# Patient Record
Sex: Female | Born: 1960 | Race: Black or African American | Hispanic: No | Marital: Married | Smoking: Never smoker
Health system: Southern US, Community
[De-identification: ages and names within clinical notes are randomized; demographics above are authoritative.]

## PROBLEM LIST (undated history)

## (undated) DIAGNOSIS — J45909 Unspecified asthma, uncomplicated: Secondary | ICD-10-CM

## (undated) DIAGNOSIS — E119 Type 2 diabetes mellitus without complications: Secondary | ICD-10-CM

## (undated) HISTORY — PX: ABDOMINAL HYSTERECTOMY: SHX81

## (undated) HISTORY — PX: CHOLECYSTECTOMY: SHX55

---

## 2004-03-02 ENCOUNTER — Ambulatory Visit: Payer: Self-pay | Admitting: Obstetrics and Gynecology

## 2004-03-10 ENCOUNTER — Ambulatory Visit: Payer: Self-pay | Admitting: Obstetrics and Gynecology

## 2004-04-30 ENCOUNTER — Ambulatory Visit: Payer: Self-pay | Admitting: Surgery

## 2005-01-18 ENCOUNTER — Ambulatory Visit: Payer: Self-pay | Admitting: Internal Medicine

## 2005-03-30 ENCOUNTER — Ambulatory Visit: Payer: Self-pay | Admitting: Obstetrics and Gynecology

## 2005-05-24 ENCOUNTER — Other Ambulatory Visit: Payer: Self-pay

## 2005-05-24 ENCOUNTER — Emergency Department: Payer: Self-pay | Admitting: Emergency Medicine

## 2006-02-06 ENCOUNTER — Ambulatory Visit: Payer: Self-pay | Admitting: Internal Medicine

## 2006-05-24 ENCOUNTER — Ambulatory Visit: Payer: Self-pay | Admitting: Obstetrics and Gynecology

## 2007-02-18 ENCOUNTER — Ambulatory Visit: Payer: Self-pay | Admitting: Internal Medicine

## 2007-04-02 ENCOUNTER — Ambulatory Visit: Payer: Self-pay | Admitting: Emergency Medicine

## 2007-04-13 ENCOUNTER — Telehealth (INDEPENDENT_AMBULATORY_CARE_PROVIDER_SITE_OTHER): Payer: Self-pay | Admitting: *Deleted

## 2007-04-16 DIAGNOSIS — J45909 Unspecified asthma, uncomplicated: Secondary | ICD-10-CM | POA: Insufficient documentation

## 2007-04-16 DIAGNOSIS — J309 Allergic rhinitis, unspecified: Secondary | ICD-10-CM | POA: Insufficient documentation

## 2007-06-20 ENCOUNTER — Ambulatory Visit: Payer: Self-pay | Admitting: Obstetrics and Gynecology

## 2007-09-23 ENCOUNTER — Ambulatory Visit: Payer: Self-pay | Admitting: Family Medicine

## 2008-03-14 HISTORY — PX: BREAST EXCISIONAL BIOPSY: SUR124

## 2008-08-13 ENCOUNTER — Ambulatory Visit: Payer: Self-pay | Admitting: Obstetrics and Gynecology

## 2008-11-04 ENCOUNTER — Ambulatory Visit: Payer: Self-pay

## 2008-12-12 ENCOUNTER — Ambulatory Visit: Payer: Self-pay | Admitting: Family Medicine

## 2009-02-03 ENCOUNTER — Observation Stay: Payer: Self-pay | Admitting: Student

## 2010-02-16 ENCOUNTER — Ambulatory Visit: Payer: Self-pay | Admitting: Obstetrics and Gynecology

## 2010-03-11 ENCOUNTER — Ambulatory Visit: Payer: Self-pay | Admitting: Internal Medicine

## 2010-05-09 ENCOUNTER — Ambulatory Visit: Payer: Self-pay | Admitting: Family Medicine

## 2010-07-30 NOTE — Assessment & Plan Note (Signed)
[facility]                             PULMONARY OFFICE NOTE   LAKIMA, DONA                      MRN:          [id number]  DATE:02/06/2006                            DOB:          1960/11/26    PROBLEMS:  1. Allergic asthma.  2. Allergic rhinitis.   HISTORY OF PRESENT ILLNESS:  One year follow up.  Had quit allergy  vaccine two years ago.  Used rescue inhaler twice last week, only for  sensation that she could not get her throat to clear, which she has not  wheezed, and she has not needed Advair or Flonase.  She got an Health visitor, which she credits for doing better.  Has noticed some decreased  sense of smell, but says sense of taste is well preserved, and she  denies congestion in her ears, chest tightness, wheeze or purulent  discharge.   MEDICATIONS:  1. Birth control pills.  2. Albuterol Rescue Inhaler.   OBJECTIVE:  VITAL SIGNS:  Weight 274 pounds, blood pressure 128/70,  pulse rate 95, room air saturation 97%.  HEENT:  Conjunctivae, nasal mucosa and pharynx look normal.  CHEST:  Clear with no wheezes, rales or rhonchi.  HEART:  Regular without murmur or gallops.  EXTREMITIES:  No peripheral edema or cyanosis.   IMPRESSION:  Minimal intermittent asthma, mild and currently stable  allergic rhinitis.   PLAN:  I refilled her albuterol inhaler with discussion.  Suggested  trial of decongestant to see if that would improve her sense of smell,  and offered to see her again p.r.n.     Clinton D. Maple Hudson, MD, Tonny Bollman, FACP  Electronically Signed    CDY/MedQ  DD: 02/11/2006  DT: 02/13/2006  Job #: [id number]   cc:   Mickey Farber

## 2011-01-01 ENCOUNTER — Ambulatory Visit: Payer: Self-pay

## 2011-11-02 ENCOUNTER — Ambulatory Visit: Payer: Self-pay | Admitting: Obstetrics and Gynecology

## 2012-04-19 ENCOUNTER — Ambulatory Visit: Payer: Self-pay

## 2012-04-21 ENCOUNTER — Ambulatory Visit: Payer: Self-pay | Admitting: Emergency Medicine

## 2012-04-23 ENCOUNTER — Ambulatory Visit: Payer: Self-pay

## 2012-04-25 ENCOUNTER — Ambulatory Visit: Payer: Self-pay | Admitting: Family Medicine

## 2012-04-29 ENCOUNTER — Ambulatory Visit: Payer: Self-pay | Admitting: Family Medicine

## 2012-05-02 ENCOUNTER — Ambulatory Visit: Payer: Self-pay | Admitting: Family Medicine

## 2012-06-01 ENCOUNTER — Ambulatory Visit: Payer: Self-pay | Admitting: Emergency Medicine

## 2012-06-01 LAB — CBC WITH DIFFERENTIAL/PLATELET
Basophil #: 0.1 10*3/uL (ref 0.0–0.1)
Basophil %: 0.6 %
Eosinophil #: 0.1 10*3/uL (ref 0.0–0.7)
Eosinophil %: 0.8 %
HCT: 40.2 % (ref 35.0–47.0)
HGB: 12.9 g/dL (ref 12.0–16.0)
Lymphocyte #: 4.7 10*3/uL — ABNORMAL HIGH (ref 1.0–3.6)
Lymphocyte %: 36.8 %
MCH: 29.3 pg (ref 26.0–34.0)
MCHC: 32 g/dL (ref 32.0–36.0)
MCV: 92 fL (ref 80–100)
Monocyte #: 0.9 x10 3/mm (ref 0.2–0.9)
Monocyte %: 7 %
Neutrophil #: 7 10*3/uL — ABNORMAL HIGH (ref 1.4–6.5)
Neutrophil %: 54.8 %
Platelet: 299 10*3/uL (ref 150–440)
RBC: 4.38 10*6/uL (ref 3.80–5.20)
RDW: 13.8 % (ref 11.5–14.5)
WBC: 12.8 10*3/uL — ABNORMAL HIGH (ref 3.6–11.0)

## 2012-06-01 LAB — COMPREHENSIVE METABOLIC PANEL
Albumin: 3.8 g/dL (ref 3.4–5.0)
Alkaline Phosphatase: 112 U/L (ref 50–136)
Anion Gap: 10 (ref 7–16)
BUN: 14 mg/dL (ref 7–18)
Bilirubin,Total: 0.3 mg/dL (ref 0.2–1.0)
Calcium, Total: 9.8 mg/dL (ref 8.5–10.1)
Chloride: 103 mmol/L (ref 98–107)
Co2: 29 mmol/L (ref 21–32)
Creatinine: 0.83 mg/dL (ref 0.60–1.30)
EGFR (African American): >60
EGFR (Non-African Amer.): >60
Glucose: 110 mg/dL — ABNORMAL HIGH (ref 65–99)
Osmolality: 284 (ref 275–301)
Potassium: 4.2 mmol/L (ref 3.5–5.1)
SGOT(AST): 13 U/L — ABNORMAL LOW (ref 15–37)
SGPT (ALT): 32 U/L (ref 12–78)
Sodium: 142 mmol/L (ref 136–145)
Total Protein: 7.7 g/dL (ref 6.4–8.2)

## 2012-06-01 LAB — TSH: Thyroid Stimulating Horm: 2.5 u[IU]/mL

## 2012-10-18 ENCOUNTER — Emergency Department: Payer: Self-pay | Admitting: Emergency Medicine

## 2012-10-18 ENCOUNTER — Ambulatory Visit: Payer: Self-pay

## 2012-10-18 LAB — COMPREHENSIVE METABOLIC PANEL
Albumin: 3.4 g/dL (ref 3.4–5.0)
Bilirubin,Total: 0.3 mg/dL (ref 0.2–1.0)
Calcium, Total: 9 mg/dL (ref 8.5–10.1)
Chloride: 109 mmol/L — ABNORMAL HIGH (ref 98–107)
Co2: 27 mmol/L (ref 21–32)
Creatinine: 0.74 mg/dL (ref 0.60–1.30)
EGFR (Non-African Amer.): >60
Glucose: 114 mg/dL — ABNORMAL HIGH (ref 65–99)
Potassium: 3.9 mmol/L (ref 3.5–5.1)
SGOT(AST): 20 U/L (ref 15–37)
SGPT (ALT): 26 U/L (ref 12–78)
Sodium: 140 mmol/L (ref 136–145)
Total Protein: 7.4 g/dL (ref 6.4–8.2)

## 2012-10-18 LAB — URINALYSIS, COMPLETE
Bilirubin,UR: NEGATIVE
Blood: NEGATIVE
Blood: NEGATIVE
Glucose,UR: NEGATIVE mg/dL (ref 0–75)
Ketone: NEGATIVE
Leukocyte Esterase: NEGATIVE
Ph: 6 (ref 4.5–8.0)
Protein: NEGATIVE
RBC,UR: NONE SEEN /HPF (ref 0–5)
Specific Gravity: 1.015 (ref 1.003–1.030)
Specific Gravity: 1.015 (ref 1.003–1.030)

## 2012-10-18 LAB — CBC
HGB: 12.4 g/dL (ref 12.0–16.0)
MCH: 30.7 pg (ref 26.0–34.0)
MCHC: 34.4 g/dL (ref 32.0–36.0)
MCV: 89 fL (ref 80–100)
Platelet: 243 10*3/uL (ref 150–440)
WBC: 8.5 10*3/uL (ref 3.6–11.0)

## 2012-10-18 LAB — LIPASE, BLOOD: Lipase: 87 U/L (ref 73–393)

## 2012-10-18 LAB — TROPONIN I: Troponin-I: <0.02 ng/mL

## 2013-06-12 ENCOUNTER — Ambulatory Visit: Payer: Self-pay | Admitting: Family Medicine

## 2013-06-17 LAB — WOUND CULTURE

## 2013-10-31 ENCOUNTER — Ambulatory Visit: Payer: Self-pay | Admitting: Physician Assistant

## 2013-12-09 ENCOUNTER — Emergency Department: Payer: Self-pay | Admitting: Emergency Medicine

## 2013-12-09 LAB — URINALYSIS, COMPLETE
Bilirubin,UR: NEGATIVE
Glucose,UR: NEGATIVE mg/dL (ref 0–75)
KETONE: NEGATIVE
Nitrite: POSITIVE
Ph: 5 (ref 4.5–8.0)
RBC,UR: 117 /HPF (ref 0–5)
SPECIFIC GRAVITY: 1.012 (ref 1.003–1.030)
Squamous Epithelial: 4
WBC UR: 558 /HPF (ref 0–5)

## 2013-12-09 LAB — COMPREHENSIVE METABOLIC PANEL
ALBUMIN: 3.6 g/dL (ref 3.4–5.0)
ANION GAP: 8 (ref 7–16)
AST: 28 U/L (ref 15–37)
Alkaline Phosphatase: 114 U/L
BUN: 10 mg/dL (ref 7–18)
Bilirubin,Total: 0.3 mg/dL (ref 0.2–1.0)
CALCIUM: 8.5 mg/dL (ref 8.5–10.1)
CHLORIDE: 110 mmol/L — AB (ref 98–107)
CREATININE: 0.76 mg/dL (ref 0.60–1.30)
Co2: 23 mmol/L (ref 21–32)
EGFR (African American): >60
EGFR (Non-African Amer.): >60
GLUCOSE: 189 mg/dL — AB (ref 65–99)
OSMOLALITY: 285 (ref 275–301)
POTASSIUM: 4 mmol/L (ref 3.5–5.1)
SGPT (ALT): 41 U/L
Sodium: 141 mmol/L (ref 136–145)
TOTAL PROTEIN: 7.5 g/dL (ref 6.4–8.2)

## 2013-12-09 LAB — CBC
HCT: 40.1 % (ref 35.0–47.0)
HGB: 12.9 g/dL (ref 12.0–16.0)
MCH: 29.4 pg (ref 26.0–34.0)
MCHC: 32.3 g/dL (ref 32.0–36.0)
MCV: 91 fL (ref 80–100)
PLATELETS: 290 10*3/uL (ref 150–440)
RBC: 4.41 10*6/uL (ref 3.80–5.20)
RDW: 14.2 % (ref 11.5–14.5)
WBC: 12 10*3/uL — AB (ref 3.6–11.0)

## 2013-12-11 LAB — URINE CULTURE

## 2014-01-21 ENCOUNTER — Ambulatory Visit: Payer: Self-pay | Admitting: Gastroenterology

## 2014-05-21 ENCOUNTER — Ambulatory Visit: Payer: Self-pay | Admitting: Obstetrics and Gynecology

## 2014-06-16 ENCOUNTER — Ambulatory Visit
Admit: 2014-06-16 | Disposition: A | Discharge status: Home / Self Care | Payer: Self-pay | Attending: Internal Medicine | Admitting: Internal Medicine

## 2014-07-07 LAB — SURGICAL PATHOLOGY

## 2014-08-18 ENCOUNTER — Encounter: Payer: Self-pay | Admitting: Dietician

## 2014-08-18 ENCOUNTER — Encounter: Payer: 59 | Attending: Internal Medicine | Admitting: Dietician

## 2014-08-18 VITALS — Wt 252.7 lb

## 2014-08-18 DIAGNOSIS — E119 Type 2 diabetes mellitus without complications: Secondary | ICD-10-CM | POA: Insufficient documentation

## 2014-08-18 NOTE — Progress Notes (Signed)

## 2014-09-29 ENCOUNTER — Ambulatory Visit: Payer: 59

## 2014-10-06 ENCOUNTER — Ambulatory Visit: Payer: 59

## 2014-10-12 ENCOUNTER — Ambulatory Visit
Admission: EM | Admit: 2014-10-12 | Discharge: 2014-10-12 | Disposition: A | Discharge status: Home / Self Care | Payer: 59 | Attending: Emergency Medicine | Admitting: Emergency Medicine

## 2014-10-12 DIAGNOSIS — L0291 Cutaneous abscess, unspecified: Secondary | ICD-10-CM

## 2014-10-12 DIAGNOSIS — E119 Type 2 diabetes mellitus without complications: Secondary | ICD-10-CM | POA: Insufficient documentation

## 2014-10-12 DIAGNOSIS — J3089 Other allergic rhinitis: Secondary | ICD-10-CM

## 2014-10-12 DIAGNOSIS — Z79899 Other long term (current) drug therapy: Secondary | ICD-10-CM | POA: Diagnosis not present

## 2014-10-12 DIAGNOSIS — L02214 Cutaneous abscess of groin: Secondary | ICD-10-CM | POA: Insufficient documentation

## 2014-10-12 DIAGNOSIS — J302 Other seasonal allergic rhinitis: Secondary | ICD-10-CM | POA: Insufficient documentation

## 2014-10-12 DIAGNOSIS — J029 Acute pharyngitis, unspecified: Secondary | ICD-10-CM | POA: Diagnosis present

## 2014-10-12 HISTORY — DX: Type 2 diabetes mellitus without complications: E11.9

## 2014-10-12 LAB — RAPID STREP SCREEN (MED CTR MEBANE ONLY): STREPTOCOCCUS, GROUP A SCREEN (DIRECT): NEGATIVE

## 2014-10-12 MED ORDER — SULFAMETHOXAZOLE-TRIMETHOPRIM 800-160 MG PO TABS: 1.0000 | ORAL_TABLET | Freq: Two times a day (BID) | ORAL | Status: AC

## 2014-10-12 MED ORDER — MUPIROCIN CALCIUM 2 % EX CREA: 1.0000 "application " | TOPICAL_CREAM | Freq: Three times a day (TID) | CUTANEOUS | Status: DC

## 2014-10-12 NOTE — ED Notes (Signed)
Sore throat for about a week. Not improving feels worse at night. Runny nose.

## 2014-10-12 NOTE — Discharge Instructions (Signed)
° ° °  Warm packs or heat pad to inflamed area Hairdryer to dry Cotton underthings Avoid talc powders Antibiotics by mouth twice daily x 10 days Cream antibiotic as needed  Abscess An abscess is an infected area that contains a collection of pus and debris.It can occur in almost any part of the body. An abscess is also known as a furuncle or boil. CAUSES  An abscess occurs when tissue gets infected. This can occur from blockage of oil or sweat glands, infection of hair follicles, or a minor injury to the skin. As the body tries to fight the infection, pus collects in the area and creates pressure under the skin. This pressure causes pain. People with weakened immune systems have difficulty fighting infections and get certain abscesses more often.  SYMPTOMS Usually an abscess develops on the skin and becomes a painful mass that is red, warm, and tender. If the abscess forms under the skin, you may feel a moveable soft area under the skin. Some abscesses break open (rupture) on their own, but most will continue to get worse without care. The infection can spread deeper into the body and eventually into the bloodstream, causing you to feel ill.  DIAGNOSIS  Your caregiver will take your medical history and perform a physical exam. A sample of fluid may also be taken from the abscess to determine what is causing your infection. TREATMENT  Your caregiver may prescribe antibiotic medicines to fight the infection. However, taking antibiotics alone usually does not cure an abscess. Your caregiver may need to make a small cut (incision) in the abscess to drain the pus. In some cases, gauze is packed into the abscess to reduce pain and to continue draining the area. HOME CARE INSTRUCTIONS   Only take over-the-counter or prescription medicines for pain, discomfort, or fever as directed by your caregiver.  If you were prescribed antibiotics, take them as directed. Finish them even if you start to feel  better.  If gauze is used, follow your caregiver's directions for changing the gauze.  To avoid spreading the infection:  Keep your draining abscess covered with a bandage.  Wash your hands well.  Do not share personal care items, towels, or whirlpools with others.  Avoid skin contact with others.  Keep your skin and clothes clean around the abscess.  Keep all follow-up appointments as directed by your caregiver. SEEK MEDICAL CARE IF:   You have increased pain, swelling, redness, fluid drainage, or bleeding.  You have muscle aches, chills, or a general ill feeling.  You have a fever. MAKE SURE YOU:   Understand these instructions.  Will watch your condition.  Will get help right away if you are not doing well or get worse. Document Released: 12/08/2004 Document Revised: 08/30/2011 Document Reviewed: 05/13/2011 [facility] Patient Information 2015 [place], [place]. This information is not intended to replace advice given to you by your health care provider. Make sure you discuss any questions you have with your health care provider.

## 2014-10-14 ENCOUNTER — Encounter: Payer: Self-pay | Admitting: Physician Assistant

## 2014-10-14 LAB — CULTURE, GROUP A STREP (THRC)

## 2014-10-14 NOTE — ED Provider Notes (Signed)
CSN: [id number]     Arrival date & time 10/12/14  1155 History   First MD Initiated Contact with Patient 10/12/14 1224     Chief Complaint  Patient presents with  . Sore Throat   (Consider location/radiation/quality/duration/timing/severity/associated sxs/prior Treatment) HPI 54 yo F concerned about scratchy sore throat for past few days, no fever. Has sinus congestion and post nasal drip that is irritating, clears throat frequently, evening non-productive cough  Also has recurrent issue with abscess formation in bilateral axilla and the mons pubis and groin area. Treated with warm packs and antibiotics in past. Has not had surgical consultation. Uses cotton underthings.  Diabetic/metformin.   Overweight 252   Reports recent A1C as 5.3, congratulated !  112 FBS  Past Medical History  Diagnosis Date  . Diabetes mellitus without complication    History reviewed. No pertinent past surgical history. History reviewed. No pertinent family history. History  Substance Use Topics  . Smoking status: Never Smoker   . Smokeless tobacco: Not on file  . Alcohol Use: No   OB History    No data available     Review of Systems Constitutional -afebrile Eyes-denies visual changes ENT- normal voice,reports sore throat few days-see above notes CV-denies chest pain Resp-denies SOB GI- negative for nausea,vomiting, diarrhea GU- negative for dysuria MSK- negative for back pain, ambulatory Skin-  Allergies  Latex and Penicillins  Home Medications   Prior to Admission medications   Medication Sig Start Date End Date Taking? Authorizing Provider  acetaminophen (TYLENOL) 500 MG tablet 1-2 tablets by mouth  as needed for pain    Historical Provider, MD  cyanocobalamin (,VITAMIN B-12,) 1000 MCG/ML injection 1,000 mcg monthly.    Historical Provider, MD  estrogens, conjugated, (PREMARIN) 0.3 MG tablet Take by mouth. 05/12/14   Historical Provider, MD  etodolac (LODINE) 500 MG tablet Take by mouth.     Historical Provider, MD  glimepiride (AMARYL) 4 MG tablet Take by mouth. 05/02/14 05/02/15  Historical Provider, MD  metFORMIN (GLUCOPHAGE-XR) 500 MG 24 hr tablet Take by mouth. 05/02/14 05/02/15  Historical Provider, MD  mupirocin cream (BACTROBAN) 2 % Apply 1 application topically 3 (three) times daily. 10/12/14   Jan Fireman, PA-C  pantoprazole (PROTONIX) 40 MG tablet Take by mouth. 01/30/14 01/30/15  Historical Provider, MD  phentermine (ADIPEX-P) 37.5 MG tablet Take by mouth.    Historical Provider, MD  sulfamethoxazole-trimethoprim (BACTRIM DS,SEPTRA DS) 800-160 MG per tablet Take 1 tablet by mouth 2 (two) times daily. 10/12/14 10/19/14  Jan Fireman, PA-C   BP 123/72 mmHg  Pulse 84  Temp(Src) 98 F (36.7 C) (Oral)  Resp 20  Ht 5' 7.5" (1.715 m)  SpO2 98% Physical Exam   Constitutional -alert and oriented,well appearing, mild sore throat distress Head-atraumatic, normocephalic Eyes- conjunctiva normal, EOMI ,conjugate gaze Nose- no congestion or rhinorrhea Mouth/throat- mucous membranes moist ,erythematous, no exudate   Strep Neg Neck- supple without glandular enlargement CV- regular rate, grossly normal heart sounds,  Resp-no distress, normal respiratory effort,clear to auscultation bilaterally GI- soft,non-tender,no distention GU- see skin notes below MSK- no tender, normal ROM, all extremities, ambulatory, self-care Neuro- normal speech and language, no gross focal neurological deficit appreciated, no gait instability, Skin-scarring, mild distortion of bilateral axillary areas from previous infections; resolving small abscess right and larger ,tender current abscess in left axilla- not organized enough to I&D. Similar changes of the mons pubis with scarring and small areas in different stages of resolving, linars 3 cm firm tender band  left lateral mons, not draining, + induration Neuro- negative headache,focal weakness or numbness Psych-mood and affect grossly normal; speech and  behavior grossly normal ED Course  Procedures (including critical care time) Labs Review Labs Reviewed  RAPID STREP SCREEN (NOT AT [facility])  CULTURE, GROUP A STREP ([facility] ONLY)    strep screen negative  Imaging Review No results found.   MDM   1. Environmental and seasonal allergies   2. Abscess    Plan: 1. Test results-strep negative  and diagnosis suspected seasonal allergies- reviewed with patient 2. Rx as per orders Claritin /Flonase; risks, benefits, potential side effects reviewed with patient 3. Recommend supportive treatment with guaifenesin DM, tylenol/ibuprofen  4. Warm compresses to irritated skin areas- do not pick or squeeze. Use Hairdryer to gently dry involved skin-do not burn. ! Do not pick or squeeze! Use topical Rx on anything that is tender or draining. Please stop using talcum powder same areas.   Continue weight loss-exercise- has difficulty with sweating--suggest fresh cotton panties mid-day if feasible in office- Avoid warm/dark/wet and strive for cool/bright/dry in play clothes as well and night clothes. Discharge Medication List as of 10/12/2014 12:56 PM    START taking these medications   Details  mupirocin cream (BACTROBAN) 2 % Apply 1 application topically 3 (three) times daily., Starting 10/12/2014, Until Discontinued, Normal    sulfamethoxazole-trimethoprim (BACTRIM DS,SEPTRA DS) 800-160 MG per tablet Take 1 tablet by mouth 2 (two) times daily., Starting 10/12/2014, Until Sun 10/19/14, Normal          Jan Fireman, PA-C 10/14/14 1202

## 2014-10-20 ENCOUNTER — Encounter: Payer: Self-pay | Admitting: *Deleted

## 2014-10-27 ENCOUNTER — Ambulatory Visit
Admission: EM | Admit: 2014-10-27 | Discharge: 2014-10-27 | Disposition: A | Discharge status: Home / Self Care | Payer: 59 | Attending: Family Medicine | Admitting: Family Medicine

## 2014-10-27 ENCOUNTER — Encounter: Payer: Self-pay | Admitting: Registered Nurse

## 2014-10-27 ENCOUNTER — Ambulatory Visit: Payer: 59

## 2014-10-27 DIAGNOSIS — B349 Viral infection, unspecified: Secondary | ICD-10-CM | POA: Diagnosis not present

## 2014-10-27 DIAGNOSIS — K5900 Constipation, unspecified: Secondary | ICD-10-CM | POA: Diagnosis not present

## 2014-10-27 DIAGNOSIS — L740 Miliaria rubra: Secondary | ICD-10-CM | POA: Diagnosis not present

## 2014-10-27 DIAGNOSIS — R109 Unspecified abdominal pain: Secondary | ICD-10-CM | POA: Diagnosis present

## 2014-10-27 DIAGNOSIS — H6593 Unspecified nonsuppurative otitis media, bilateral: Secondary | ICD-10-CM | POA: Diagnosis not present

## 2014-10-27 HISTORY — DX: Unspecified asthma, uncomplicated: J45.909

## 2014-10-27 LAB — COMPREHENSIVE METABOLIC PANEL
ALT: 22 U/L (ref 14–54)
AST: 16 U/L (ref 15–41)
Albumin: 4.4 g/dL (ref 3.5–5.0)
Alkaline Phosphatase: 77 U/L (ref 38–126)
Anion gap: 7 (ref 5–15)
BILIRUBIN TOTAL: 0.3 mg/dL (ref 0.3–1.2)
BUN: 14 mg/dL (ref 6–20)
CHLORIDE: 102 mmol/L (ref 101–111)
CO2: 30 mmol/L (ref 22–32)
Calcium: 9.6 mg/dL (ref 8.9–10.3)
Creatinine, Ser: 0.75 mg/dL (ref 0.44–1.00)
Glucose, Bld: 70 mg/dL (ref 65–99)
POTASSIUM: 3.9 mmol/L (ref 3.5–5.1)
Sodium: 139 mmol/L (ref 135–145)
TOTAL PROTEIN: 8.6 g/dL — AB (ref 6.5–8.1)

## 2014-10-27 LAB — URINALYSIS COMPLETE WITH MICROSCOPIC (ARMC ONLY)
BILIRUBIN URINE: NEGATIVE
Bacteria, UA: NONE SEEN — AB
Glucose, UA: NEGATIVE mg/dL
Hgb urine dipstick: NEGATIVE
KETONES UR: NEGATIVE mg/dL
Leukocytes, UA: NEGATIVE
Nitrite: NEGATIVE
PH: 6 (ref 5.0–8.0)
Protein, ur: NEGATIVE mg/dL
RBC / HPF: NONE SEEN RBC/hpf (ref <3)
SPECIFIC GRAVITY, URINE: 1.01 (ref 1.005–1.030)
WBC, UA: NONE SEEN WBC/hpf (ref <3)

## 2014-10-27 LAB — CBC WITH DIFFERENTIAL/PLATELET
Basophils Absolute: 0.1 10*3/uL (ref 0–0.1)
Basophils Relative: 1 %
EOS PCT: 1 %
Eosinophils Absolute: 0.1 10*3/uL (ref 0–0.7)
HCT: 38.8 % (ref 35.0–47.0)
Hemoglobin: 13 g/dL (ref 12.0–16.0)
LYMPHS ABS: 3 10*3/uL (ref 1.0–3.6)
Lymphocytes Relative: 33 %
MCH: 30.5 pg (ref 26.0–34.0)
MCHC: 33.5 g/dL (ref 32.0–36.0)
MCV: 91.2 fL (ref 80.0–100.0)
Monocytes Absolute: 0.5 10*3/uL (ref 0.2–0.9)
Monocytes Relative: 5 %
Neutro Abs: 5.4 10*3/uL (ref 1.4–6.5)
Neutrophils Relative %: 60 %
PLATELETS: 294 10*3/uL (ref 150–440)
RBC: 4.25 MIL/uL (ref 3.80–5.20)
RDW: 13.4 % (ref 11.5–14.5)
WBC: 9.1 10*3/uL (ref 3.6–11.0)

## 2014-10-27 LAB — AMYLASE: Amylase: 68 U/L (ref 28–100)

## 2014-10-27 LAB — LIPASE, BLOOD: LIPASE: 11 U/L — AB (ref 22–51)

## 2014-10-27 MED ORDER — FLUTICASONE PROPIONATE 50 MCG/ACT NA SUSP: 1.0000 | Freq: Two times a day (BID) | NASAL | Status: DC

## 2014-10-27 MED ORDER — ONDANSETRON 8 MG PO TBDP
8.0000 mg | ORAL_TABLET | Freq: Once | ORAL | Status: AC
  Administered 2014-10-27: 8 mg via ORAL

## 2014-10-27 MED ORDER — ONDANSETRON 8 MG PO TBDP: 8.0000 mg | ORAL_TABLET | Freq: Two times a day (BID) | ORAL | Status: DC

## 2014-10-27 MED ORDER — LORATADINE 10 MG PO TABS: 10.0000 mg | ORAL_TABLET | Freq: Every day | ORAL | Status: DC

## 2014-10-27 NOTE — Discharge Instructions (Signed)
High-Fiber Diet Fiber is found in fruits, vegetables, and grains. A high-fiber diet encourages the addition of more whole grains, legumes, fruits, and vegetables in your diet. The recommended amount of fiber for adult males is 38 g per day. For adult females, it is 25 g per day. Pregnant and lactating women should get 28 g of fiber per day. If you have a digestive or bowel problem, ask your caregiver for advice before adding high-fiber foods to your diet. Eat a variety of high-fiber foods instead of only a select few type of foods.  PURPOSE  To increase stool bulk.  To make bowel movements more regular to prevent constipation.  To lower cholesterol.  To prevent overeating. WHEN IS THIS DIET USED?  It may be used if you have constipation and hemorrhoids.  It may be used if you have uncomplicated diverticulosis (intestine condition) and irritable bowel syndrome.  It may be used if you need help with weight management.  It may be used if you want to add it to your diet as a protective measure against atherosclerosis, diabetes, and cancer. SOURCES OF FIBER  Whole-grain breads and cereals.  Fruits, such as apples, oranges, bananas, berries, prunes, and pears.  Vegetables, such as green peas, carrots, sweet potatoes, beets, broccoli, cabbage, spinach, and artichokes.  Legumes, such split peas, soy, lentils.  Almonds. FIBER CONTENT IN FOODS Starches and Grains / Dietary Fiber (g)  Cheerios, 1 cup / 3 g  Corn Flakes cereal, 1 cup / 0.7 g  Rice crispy treat cereal, 1 cup / 0.3 g  Instant oatmeal (cooked),  cup / 2 g  Frosted wheat cereal, 1 cup / 5.1 g  Brown, long-grain rice (cooked), 1 cup / 3.5 g  White, long-grain rice (cooked), 1 cup / 0.6 g  Enriched macaroni (cooked), 1 cup / 2.5 g Legumes / Dietary Fiber (g)  Baked beans (canned, plain, or vegetarian),  cup / 5.2 g  Kidney beans (canned),  cup / 6.8 g  Pinto beans (cooked),  cup / 5.5 g Breads and Crackers  / Dietary Fiber (g)  Plain or honey graham crackers, 2 squares / 0.7 g  Saltine crackers, 3 squares / 0.3 g  Plain, salted pretzels, 10 pieces / 1.8 g  Whole-wheat bread, 1 slice / 1.9 g  White bread, 1 slice / 0.7 g  Raisin bread, 1 slice / 1.2 g  Plain bagel, 3 oz / 2 g  Flour tortilla, 1 oz / 0.9 g  Corn tortilla, 1 small / 1.5 g  Hamburger or hotdog bun, 1 small / 0.9 g Fruits / Dietary Fiber (g)  Apple with skin, 1 medium / 4.4 g  Sweetened applesauce,  cup / 1.5 g  Banana,  medium / 1.5 g  Grapes, 10 grapes / 0.4 g  Orange, 1 small / 2.3 g  Raisin, 1.5 oz / 1.6 g  Melon, 1 cup / 1.4 g Vegetables / Dietary Fiber (g)  Green beans (canned),  cup / 1.3 g  Carrots (cooked),  cup / 2.3 g  Broccoli (cooked),  cup / 2.8 g  Peas (cooked),  cup / 4.4 g  Mashed potatoes,  cup / 1.6 g  Lettuce, 1 cup / 0.5 g  Corn (canned),  cup / 1.6 g  Tomato,  cup / 1.1 g Document Released: 02/28/2005 Document Revised: 08/30/2011 Document Reviewed: 06/02/2011 [employer] Patient Information 2015 [place], [place]. This information is not intended to replace advice given to you by your health care provider.  Make sure you discuss any questions you have with your health care provider. Constipation Constipation is when a person has fewer than three bowel movements a week, has difficulty having a bowel movement, or has stools that are dry, hard, or larger than normal. As people grow older, constipation is more common. If you try to fix constipation with medicines that make you have a bowel movement (laxatives), the problem may get worse. Long-term laxative use may cause the muscles of the colon to become weak. A low-fiber diet, not taking in enough fluids, and taking certain medicines may make constipation worse.  CAUSES   Certain medicines, such as antidepressants, pain medicine, iron supplements, antacids, and water pills.   Certain diseases, such as diabetes, irritable  bowel syndrome (IBS), thyroid disease, or depression.   Not drinking enough water.   Not eating enough fiber-rich foods.   Stress or travel.   Lack of physical activity or exercise.   Ignoring the urge to have a bowel movement.   Using laxatives too much.  SIGNS AND SYMPTOMS   Having fewer than three bowel movements a week.   Straining to have a bowel movement.   Having stools that are hard, dry, or larger than normal.   Feeling full or bloated.   Pain in the lower abdomen.   Not feeling relief after having a bowel movement.  DIAGNOSIS  Your health care provider will take a medical history and perform a physical exam. Further testing may be done for severe constipation. Some tests may include:  A barium enema X-ray to examine your rectum, colon, and, sometimes, your small intestine.   A sigmoidoscopy to examine your lower colon.   A colonoscopy to examine your entire colon. TREATMENT  Treatment will depend on the severity of your constipation and what is causing it. Some dietary treatments include drinking more fluids and eating more fiber-rich foods. Lifestyle treatments may include regular exercise. If these diet and lifestyle recommendations do not help, your health care provider may recommend taking over-the-counter laxative medicines to help you have bowel movements. Prescription medicines may be prescribed if over-the-counter medicines do not work.  HOME CARE INSTRUCTIONS   Eat foods that have a lot of fiber, such as fruits, vegetables, whole grains, and beans.  Limit foods high in fat and processed sugars, such as french fries, hamburgers, cookies, candies, and soda.   A fiber supplement may be added to your diet if you cannot get enough fiber from foods.   Drink enough fluids to keep your urine clear or pale yellow.   Exercise regularly or as directed by your health care provider.   Go to the restroom when you have the urge to go. Do not hold  it.   Only take over-the-counter or prescription medicines as directed by your health care provider. Do not take other medicines for constipation without talking to your health care provider first.  [place] IF:   You have bright red blood in your stool.   Your constipation lasts for more than 4 days or gets worse.   You have abdominal or rectal pain.   You have thin, pencil-like stools.   You have unexplained weight loss. MAKE SURE YOU:   Understand these instructions.  Will watch your condition.  Will get help right away if you are not doing well or get worse. Document Released: 11/27/2003 Document Revised: 03/05/2013 Document Reviewed: 12/10/2012 [facility] Patient Information 2015 [place], [place]. This information is not intended to replace advice  given to you by your health care provider. Make sure you discuss any questions you have with your health care provider. Viral Gastroenteritis Viral gastroenteritis is also known as stomach flu. This condition affects the stomach and intestinal tract. It can cause sudden diarrhea and vomiting. The illness typically lasts 3 to 8 days. Most people develop an immune response that eventually gets rid of the virus. While this natural response develops, the virus can make you quite ill. CAUSES  Many different viruses can cause gastroenteritis, such as rotavirus or noroviruses. You can catch one of these viruses by consuming contaminated food or water. You may also catch a virus by sharing utensils or other personal items with an infected person or by touching a contaminated surface. SYMPTOMS  The most common symptoms are diarrhea and vomiting. These problems can cause a severe loss of body fluids (dehydration) and a body salt (electrolyte) imbalance. Other symptoms may include:  Fever.  Headache.  Fatigue.  Abdominal pain. DIAGNOSIS  Your caregiver can usually diagnose viral gastroenteritis based on your symptoms  and a physical exam. A stool sample may also be taken to test for the presence of viruses or other infections. TREATMENT  This illness typically goes away on its own. Treatments are aimed at rehydration. The most serious cases of viral gastroenteritis involve vomiting so severely that you are not able to keep fluids down. In these cases, fluids must be given through an intravenous line (IV). HOME CARE INSTRUCTIONS   Drink enough fluids to keep your urine clear or pale yellow. Drink small amounts of fluids frequently and increase the amounts as tolerated.  Ask your caregiver for specific rehydration instructions.  Avoid:  Foods high in sugar.  Alcohol.  Carbonated drinks.  Tobacco.  Juice.  Caffeine drinks.  Extremely hot or cold fluids.  Fatty, greasy foods.  Too much intake of anything at one time.  Dairy products until 24 to 48 hours after diarrhea stops.  You may consume probiotics. Probiotics are active cultures of beneficial bacteria. They may lessen the amount and number of diarrheal stools in adults. Probiotics can be found in yogurt with active cultures and in supplements.  Wash your hands well to avoid spreading the virus.  Only take over-the-counter or prescription medicines for pain, discomfort, or fever as directed by your caregiver. Do not give aspirin to children. Antidiarrheal medicines are not recommended.  Ask your caregiver if you should continue to take your regular prescribed and over-the-counter medicines.  Keep all follow-up appointments as directed by your caregiver. SEEK IMMEDIATE MEDICAL CARE IF:   You are unable to keep fluids down.  You do not urinate at least once every 6 to 8 hours.  You develop shortness of breath.  You notice blood in your stool or vomit. This may look like coffee grounds.  You have abdominal pain that increases or is concentrated in one small area (localized).  You have persistent vomiting or diarrhea.  You have a  fever.  The patient is a child younger than 3 months, and he or she has a fever.  The patient is a child older than 3 months, and he or she has a fever and persistent symptoms.  The patient is a child older than 3 months, and he or she has a fever and symptoms suddenly get worse.  The patient is a baby, and he or she has no tears when crying. MAKE SURE YOU:   Understand these instructions.  Will watch your condition.  Will  get help right away if you are not doing well or get worse. Document Released: 02/28/2005 Document Revised: 05/23/2011 Document Reviewed: 12/15/2010 [facility] Patient Information 2015 [place], [place]. This information is not intended to replace advice given to you by your health care provider. Make sure you discuss any questions you have with your health care provider. Heat Rash Heat rash (miliaria) is a skin irritation caused by heavy sweating during hot, humid weather. It results from blockage of the sweat glands on our body. It can occur at any age. It is most common in young children whose sweat ducts are still developing or are not fully developed. Tight clothing may make the condition worse. Heat rash can look like small blisters (vesicles) that break open easily with bathing or minimal pressure. These blisters are found most commonly on the face, upper trunk of children and the trunk of adults. It can also look like a red cluster of red bumps or pimples (pustules). These usually itch and can also sometimes burn. It is more likely to occur on the neck and upper chest, in the groin, under the breasts, and in elbow creases. HOME CARE INSTRUCTIONS   The best treatment for heat rash is to provide a cooler, less humid environment where sweating is much decreased.  Keep the affected area dry. Dusting powder (cornstarch powder, baby powder) may be used to increase comfort. Avoid using ointments or creams. They keep the skin warm and moist and may make the condition  worse.  Treating heat rash is simple and usually does not require medical assistance. SEEK MEDICAL CARE IF:   There is any evidence of infection such as fever, redness, swelling.  There is discomfort such as pain.  The skin lesions do no resolve with cooler, dryer environment. MAKE SURE YOU:   Understand these instructions.  Will watch your condition.  Will get help right away if you are not doing well or get worse. Document Released: 02/16/2009 Document Revised: 05/23/2011 Document Reviewed: 02/16/2009 [facility] Patient Information 2015 [place], [place]. This information is not intended to replace advice given to you by your health care provider. Make sure you discuss any questions you have with your health care provider. Otitis Media With Effusion Otitis media with effusion is the presence of fluid in the middle ear. This is a common problem in children, which often follows ear infections. It may be present for weeks or longer after the infection. Unlike an acute ear infection, otitis media with effusion refers only to fluid behind the ear drum and not infection. Children with repeated ear and sinus infections and allergy problems are the most likely to get otitis media with effusion. CAUSES  The most frequent cause of the fluid buildup is dysfunction of the eustachian tubes. These are the tubes that drain fluid in the ears to the back of the nose (nasopharynx). SYMPTOMS   The main symptom of this condition is hearing loss. As a result, you or your child may:  Listen to the TV at a loud volume.  Not respond to questions.  Ask "what" often when spoken to.  Mistake or confuse one sound or word for another.  There may be a sensation of fullness or pressure but usually not pain. DIAGNOSIS   Your health care provider will diagnose this condition by examining you or your child's ears.  Your health care provider may test the pressure in you or your child's ear with a  tympanometer.  A hearing test may be conducted if the  problem persists. TREATMENT   Treatment depends on the duration and the effects of the effusion.  Antibiotics, decongestants, nose drops, and cortisone-type drugs (tablets or nasal spray) may not be helpful.  Children with persistent ear effusions may have delayed language or behavioral problems. Children at risk for developmental delays in hearing, learning, and speech may require referral to a specialist earlier than children not at risk.  You or your child's health care provider may suggest a referral to an ear, nose, and throat surgeon for treatment. The following may help restore normal hearing:  Drainage of fluid.  Placement of ear tubes (tympanostomy tubes).  Removal of adenoids (adenoidectomy). HOME CARE INSTRUCTIONS   Avoid secondhand smoke.  Infants who are breastfed are less likely to have this condition.  Avoid feeding infants while they are lying flat.  Avoid known environmental allergens.  Avoid people who are sick. SEEK MEDICAL CARE IF:   Hearing is not better in 3 months.  Hearing is worse.  Ear pain.  Drainage from the ear.  Dizziness. MAKE SURE YOU:   Understand these instructions.  Will watch your condition.  Will get help right away if you are not doing well or get worse. Document Released: 04/07/2004 Document Revised: 07/15/2013 Document Reviewed: 09/25/2012 [facility] Patient Information 2015 [place], [place]. This information is not intended to replace advice given to you by your health care provider. Make sure you discuss any questions you have with your health care provider.

## 2014-10-27 NOTE — ED Provider Notes (Signed)
CSN: [id number]     Arrival date & time 10/27/14  1138 History   First MD Initiated Contact with Patient 10/27/14 1211     Chief Complaint  Patient presents with  . Abdominal Pain  . Chills  . Nausea   (Consider location/radiation/quality/duration/timing/severity/associated sxs/prior Treatment) HPI Comments: African [place] female diabetic here with nausea, hot flashes, chills, abdomen pain right side and epigastric, vomiting last yesterday after eating watermelon.  1 sick exposure at home.  Symptoms first started 23 Oct 2014 left work early and went home to bed due to sharp pains abdomen low.  Was constipated this weekend no BMs and typically has three BMs per day.  BM this am smaller than normal flat brown formed.  Last seen at [facility] for sore throat and rash by Laurey Morale and those symptoms have completely resolved.  Felt a lump in right lower abdomen this am but now gone also noticed 1 pimple on right flank at waistline.  Throat feels like she has pill stuck in it this am.  Headache and dizzy today.  Was out in heat Saturday 13 Aug her usual back pain slightly worse today especially low back.  Urine paler than normal slight increased urgency.  Fingerstick this am 92 and previous 112.  Patient is a 54 y.o. female presenting with abdominal pain. The history is provided by the patient.  Abdominal Pain Pain location:  RLQ, epigastric and RUQ Pain quality: aching and cramping   Pain quality: not bloating, not burning, not dull, no fullness, not gnawing, not heavy, no pressure, not sharp, not shooting, not squeezing, not stabbing, no stiffness, not tearing, not throbbing and not tugging   Pain radiates to:  Does not radiate Pain severity:  Moderate Onset quality:  Sudden Duration:  5 days Timing:  Intermittent Progression:  Waxing and waning Chronicity:  New Context: alcohol use, awakening from sleep, eating, previous surgery, retching and sick contacts   Context: not diet changes, not laxative  use, not medication withdrawal, not recent illness, not recent sexual activity, not recent travel, not suspicious food intake and not trauma   Relieved by:  Nothing Worsened by:  Palpation Ineffective treatments:  Bowel activity, heat, not moving, position changes, eating, lying down, vomiting, palpation, movement, flatus and belching Associated symptoms: belching, chills, constipation, fatigue, fever, flatus, nausea and vomiting   Associated symptoms: no anorexia, no chest pain, no cough, no diarrhea, no dysuria, no hematemesis, no hematochezia, no hematuria, no melena, no shortness of breath, no sore throat, no vaginal bleeding and no vaginal discharge   Fatigue:    Severity:  Moderate   Duration:  4 days   Timing:  Constant   Progression:  Unchanged Fever:    Duration:  4 days   Timing:  Intermittent   Temp source:  Subjective   Progression:  Unchanged Nausea:    Severity:  Moderate   Onset quality:  Sudden   Duration:  4 days   Timing:  Intermittent   Progression:  Unchanged Vomiting:    Quality:  Stomach contents   Number of occurrences:  2   Severity:  Moderate   Duration:  2 days   Timing:  Intermittent   Progression:  Unchanged Risk factors: obesity   Risk factors: no alcohol abuse, no aspirin use, not elderly, has not had multiple surgeries, no NSAID use, not pregnant and no recent hospitalization     Past Medical History  Diagnosis Date  . Diabetes mellitus without complication   . Asthma  Past Surgical History  Procedure Laterality Date  . Abdominal hysterectomy     History reviewed. No pertinent family history. Social History  Substance Use Topics  . Smoking status: Never Smoker   . Smokeless tobacco: None  . Alcohol Use: No   OB History    No data available     Review of Systems  Constitutional: Positive for fever, chills and fatigue. Negative for diaphoresis, activity change and appetite change.  HENT: Negative for congestion, dental problem,  drooling, ear discharge, ear pain, facial swelling, hearing loss, mouth sores, nosebleeds, postnasal drip, rhinorrhea, sinus pressure, sneezing, sore throat, tinnitus and voice change.   Eyes: Negative for photophobia, pain, discharge, redness, itching and visual disturbance.  Respiratory: Negative for cough, choking, chest tightness, shortness of breath, wheezing and stridor.   Cardiovascular: Negative for chest pain, palpitations and leg swelling.  Gastrointestinal: Positive for nausea, vomiting, abdominal pain, constipation and flatus. Negative for diarrhea, blood in stool, melena, hematochezia, abdominal distention, anal bleeding, rectal pain, anorexia and hematemesis.  Endocrine: Positive for cold intolerance and heat intolerance. Negative for polydipsia, polyphagia and polyuria.  Genitourinary: Negative for dysuria, hematuria, vaginal bleeding and vaginal discharge.  Musculoskeletal: Positive for myalgias and back pain. Negative for joint swelling, arthralgias, gait problem, neck pain and neck stiffness.  Skin: Positive for rash. Negative for color change, pallor and wound.  Allergic/Immunologic: Positive for environmental allergies. Negative for food allergies.  Neurological: Positive for dizziness and headaches. Negative for tremors, seizures, syncope, facial asymmetry, speech difficulty, weakness, light-headedness and numbness.  Hematological: Negative for adenopathy. Does not bruise/bleed easily.  Psychiatric/Behavioral: Negative for behavioral problems, confusion, sleep disturbance and agitation.    Allergies  Latex and Penicillins  Home Medications   Prior to Admission medications   Medication Sig Start Date End Date Taking? Authorizing Provider  acetaminophen (TYLENOL) 500 MG tablet 1-2 tablets by mouth  as needed for pain    Historical Provider, MD  cyanocobalamin (,VITAMIN B-12,) 1000 MCG/ML injection 1,000 mcg monthly.    Historical Provider, MD  estrogens, conjugated,  (PREMARIN) 0.3 MG tablet Take by mouth. 05/12/14   Historical Provider, MD  etodolac (LODINE) 500 MG tablet Take by mouth.    Historical Provider, MD  fluticasone (FLONASE) 50 MCG/ACT nasal spray Place 1 spray into both nostrils 2 (two) times daily. 10/27/14   Olen Cordial, NP  glimepiride (AMARYL) 4 MG tablet Take by mouth. 05/02/14 05/02/15  Historical Provider, MD  loratadine (CLARITIN) 10 MG tablet Take 1 tablet (10 mg total) by mouth daily. 10/27/14   Olen Cordial, NP  metFORMIN (GLUCOPHAGE-XR) 500 MG 24 hr tablet Take by mouth. 05/02/14 05/02/15  Historical Provider, MD  mupirocin cream (BACTROBAN) 2 % Apply 1 application topically 3 (three) times daily. 10/12/14   Jan Fireman, PA-C  ondansetron (ZOFRAN ODT) 8 MG disintegrating tablet Take 1 tablet (8 mg total) by mouth 2 (two) times daily. 10/27/14   Olen Cordial, NP  pantoprazole (PROTONIX) 40 MG tablet Take by mouth. 01/30/14 01/30/15  Historical Provider, MD  phentermine (ADIPEX-P) 37.5 MG tablet Take by mouth.    Historical Provider, MD   BP 143/78 mmHg  Temp(Src) 98.2 F (36.8 C) (Oral)  Resp 16  SpO2 100% Physical Exam  Constitutional: She is oriented to person, place, and time. Vital signs are normal. She appears well-developed and well-nourished. No distress.  HENT:  Head: Normocephalic and atraumatic.  Right Ear: External ear normal.  Left Ear: External ear normal.  Nose: Nose normal.  Mouth/Throat: Oropharynx is clear and moist. No oropharyngeal exudate.  Eyes: Conjunctivae, EOM and lids are normal. Pupils are equal, round, and reactive to light. Right eye exhibits no discharge. Left eye exhibits no discharge. No scleral icterus.  Neck: Trachea normal and normal range of motion. Neck supple. No tracheal deviation present. No thyromegaly present.  Cardiovascular: Normal rate, regular rhythm, normal heart sounds and intact distal pulses.  Exam reveals no gallop and no friction rub.   No murmur heard. Pulmonary/Chest:  Effort normal and breath sounds normal. No stridor. No respiratory distress. She has no wheezes. She has no rales. She exhibits no tenderness.  Abdominal: Soft. She exhibits no shifting dullness, no distension, no pulsatile liver, no fluid wave, no abdominal bruit, no ascites, no pulsatile midline mass and no mass. Bowel sounds are decreased. There is no hepatosplenomegaly. There is tenderness in the right upper quadrant, right lower quadrant and epigastric area. There is no rigidity, no rebound, no guarding, no CVA tenderness, no tenderness at McBurney's point and negative Murphy's sign. No hernia. Hernia confirmed negative in the ventral area.  Dull to percussion x 4 quads  Musculoskeletal: Normal range of motion. She exhibits no edema or tenderness.  Lymphadenopathy:    She has no cervical adenopathy.  Neurological: She is alert and oriented to person, place, and time. She exhibits normal muscle tone. Coordination normal.  Skin: Skin is warm, dry and intact. Rash noted. No abrasion, no bruising, no burn, no ecchymosis, no laceration, no petechiae and no purpura noted. Rash is papular. Rash is not macular, not maculopapular, not nodular, not pustular, not vesicular and not urticarial. She is not diaphoretic. No cyanosis or erythema. No pallor. Nails show no clubbing.     Single nonerythematous papular lesion 0.5cm TTP right flank at waistline pants; stretch marks entire waist striations not tender to palpation  Psychiatric: She has a normal mood and affect. Her speech is normal and behavior is normal. Judgment and thought content normal. Cognition and memory are normal.  Nursing note and vitals reviewed.   ED Course  Procedures (including critical care time) Labs Review Labs Reviewed  COMPREHENSIVE METABOLIC PANEL - Abnormal; Notable for the following:    Total Protein 8.6 (*)    All other components within normal limits  LIPASE, BLOOD - Abnormal; Notable for the following:    Lipase 11 (*)     All other components within normal limits  URINALYSIS COMPLETEWITH MICROSCOPIC ([facility] ONLY) - Abnormal; Notable for the following:    Bacteria, UA NONE SEEN (*)    Squamous Epithelial / LPF 0-5 (*)    All other components within normal limits  CBC WITH DIFFERENTIAL/PLATELET  AMYLASE   Medications  ondansetron (ZOFRAN-ODT) disintegrating tablet 8 mg (8 mg Oral Given 10/27/14 1300)   Imaging Review Dg Abd 1 View  10/27/2014   CLINICAL DATA:  Abdominal pain, nausea, dizziness  EXAM: ABDOMEN - 1 VIEW  COMPARISON:  Ultrasound of the abdomen of 02/03/2009  FINDINGS: Supine views of the abdomen show a moderate amount of feces throughout the colon. No bowel obstruction is seen. There is slight lumbar curvature convex to the right with degenerative change at L4-5 primarily involving the facet joints. The SI joints appear corticated. Surgical clips are present in the right upper quadrant from prior cholecystectomy.  IMPRESSION: No bowel obstruction. Moderate amount of feces throughout the colon.   Electronically Signed   By: Ivar Drape M.D.   On: 10/27/2014 13:27   1330 RN Jonelle Sidle Nolon Lennert  reported serum samples drawn, patient now up in chair nausea resolved feeling better attempting po hydration.  1355 discussed lab and xray results with patient and given copy of reports.  Patient reported she is feeling much better tolerated diet ginger ale 233ml without return of nausea/vomiting.  Patient feels like she can void now discussed clean catch technique with her and she ambulated without difficulty to bathroom.  Dizzyness resolved.  Patient was sitting up in chair stated she is feeling much better also.  Patient verbalized understanding of information/instructions and had no further questions at this time.  1430 discussed urinalysis results with patient still feeling well ready to go home urinalysis normal/negative for infection.  Repeat abdomen exam no longer tender to palpation, dull to percussion x 4 quads.   Denied nausea and dizziness.  Patient verbalized understanding of information and instructions and had no further questions at this time. MDM   1. Constipation, unspecified constipation type   2. Viral illness   3. Otitis media with effusion, bilateral   4. Heat rash    Discussed with patient:  High-fiber diet Eat more high-fiber foods, which will help prevent constipation.  The best sources of fiber are whole-grain cereals, such as shredded wheat or cereals with bran.  Fresh fruit and raw or cooked vegetables, especially asparagus, cabbage, carrots, corn, and broccoli are other good sources of fiber.   . Fluids  Drink plenty of water.  This helps to soften bowel movements so they are easier to pass.   Exercise.  May require fiber supplement since she states "picky eater" trying to increase fruits/fibers and whole grains in diet.  Attempt diet modification.  Patient given Exitcare handout on constipation/dietary fiber. Keep active as much as possible as movement of body helps to keep bowels active as well versus dehydration/lying in bed/immobility. Patient agreed with plan of care verbalized understanding of information/instructions and had no further questions at this time. P2:  increase fruits/fiber/whole grains and fluid intake  Viral illness: no evidence of invasive bacterial infection, non toxic and well hydrated.  This is most likely self limiting viral infection.  I do not see where any further testing or imaging is necessary at this time.   I will suggest supportive care, rest, good hygiene and encourage the patient to take adequate fluids.  Continue flonase 1 spray each nostril BID prn, nasal saline 1-2 sprays each nostril prn q2h.  Discussed honey with lemon and salt water gargles for comfort also.  The patient is to return to clinic or EMERGENCY ROOM if symptoms worsen or change significantly e.g. fever, lethargy, SOB, wheezing.  Exitcare handout on viral illness given to patient.   Patient given work excuse for 24 hours.  I have recommended clear fluids and the BRAT diet. Avoid dairy, spicy, fried and meat until returned to normal.  Zofran 8mg  ODT po BID Rx #6 RF0 given to patient in case nausea/vomiting returns. Medications as directed.  Return to the clinic if  symptoms persist or worsen; I have alerted the patient to call if high fever, unable to urinate every 8 hours, dehydration, marked weakness, fainting, increased abdominal pain, blood in stool or vomit.  Patient verbalized agreement and understanding of treatment plan.   P2:  Hand washing and fitness  Discussed with patient fluid in ears may take up to 1 month to resolve after URI symptoms resolve.  Fluid in ears may also cause dizziness or vertigo.  Supportive treatment.   No evidence of invasive bacterial infection,  non toxic and well hydrated.  This is most likely self limiting viral infection.  I do not see where any further testing or imaging is necessary at this time.   I will suggest supportive care, rest, good hygiene and encourage the patient to take adequate fluids.  The patient is to return to clinic or EMERGENCY ROOM if symptoms worsen or change significantly e.g. ear pain, fever, purulent discharge from ears or bleeding.  Exitcare handout on otitis media with effusion given to patient.  Patient verbalized agreement and understanding of treatment plan.    Discussed with patient do not stay in sweaty clothes, change as soon as possible, dry off thoroughly after showering.  Call or return to clinic as needed if these symptoms worsen or fail to improve as anticipated.  Exitcare handout on heat rash given to patient.  Patient verbalized agreement and understanding of treatment plan.   P2:  Avoidance and hand washing.   Olen Cordial, NP 10/27/14 1453

## 2014-10-27 NOTE — ED Notes (Signed)
Pt states that she has been having lower right lateral pain since 10/22/2014. Pt states that she has nausea that has increased over the past few days. Pt states her pain is starting to decrease

## 2015-01-28 ENCOUNTER — Ambulatory Visit
Admission: EM | Admit: 2015-01-28 | Discharge: 2015-01-28 | Disposition: A | Discharge status: Home / Self Care | Payer: 59 | Attending: Family Medicine | Admitting: Family Medicine

## 2015-01-28 DIAGNOSIS — J4521 Mild intermittent asthma with (acute) exacerbation: Secondary | ICD-10-CM

## 2015-01-28 MED ORDER — GUAIFENESIN-CODEINE 100-10 MG/5ML PO SOLN: 10.0000 mL | Freq: Four times a day (QID) | ORAL | Status: DC | PRN

## 2015-01-28 MED ORDER — AZITHROMYCIN 250 MG PO TABS: ORAL_TABLET | ORAL | Status: DC

## 2015-01-28 MED ORDER — PREDNISONE 20 MG PO TABS: 20.0000 mg | ORAL_TABLET | Freq: Every day | ORAL | Status: DC

## 2015-01-28 MED ORDER — IPRATROPIUM-ALBUTEROL 0.5-2.5 (3) MG/3ML IN SOLN: 3.0000 mL | Freq: Once | RESPIRATORY_TRACT | Status: DC

## 2015-01-28 NOTE — ED Provider Notes (Signed)
CSN: [id number]     Arrival date & time 01/28/15  1703 History   First MD Initiated Contact with Patient 01/28/15 1759     Chief Complaint  Patient presents with  . URI   (Consider location/radiation/quality/duration/timing/severity/associated sxs/prior Treatment) Patient is a 54 y.o. female presenting with URI. The history is provided by the patient.  URI Presenting symptoms: congestion, cough, ear pain and rhinorrhea   Severity:  Moderate Onset quality:  Sudden Duration:  5 days Timing:  Constant Progression:  Worsening Chronicity:  New Relieved by:  None tried Worsened by:  Nothing tried Associated symptoms: headaches, sinus pain and wheezing   Associated symptoms: no arthralgias, no myalgias, no sneezing and no swollen glands   Risk factors: chronic respiratory disease (patient with a h/o mild intermittent asthma; uses albuterol occasionally)     Past Medical History  Diagnosis Date  . Diabetes mellitus without complication   . Asthma    Past Surgical History  Procedure Laterality Date  . Abdominal hysterectomy     No family history on file. Social History  Substance Use Topics  . Smoking status: Never Smoker   . Smokeless tobacco: Not on file  . Alcohol Use: No   OB History    No data available     Review of Systems  HENT: Positive for congestion, ear pain and rhinorrhea. Negative for sneezing.   Respiratory: Positive for cough and wheezing.   Musculoskeletal: Negative for myalgias and arthralgias.  Neurological: Positive for headaches.    Allergies  Latex and Penicillins  Home Medications   Prior to Admission medications   Medication Sig Start Date End Date Taking? Authorizing Provider  acetaminophen (TYLENOL) 500 MG tablet 1-2 tablets by mouth  as needed for pain   Yes Historical Provider, MD  estrogens, conjugated, (PREMARIN) 0.3 MG tablet Take by mouth. 05/12/14  Yes Historical Provider, MD  fluticasone (FLONASE) 50 MCG/ACT nasal spray Place 1  spray into both nostrils 2 (two) times daily. 10/27/14  Yes Tina A Betancourt, NP  glimepiride (AMARYL) 4 MG tablet Take by mouth. 05/02/14 05/02/15 Yes Historical Provider, MD  loratadine (CLARITIN) 10 MG tablet Take 1 tablet (10 mg total) by mouth daily. 10/27/14  Yes Olen Cordial, NP  metFORMIN (GLUCOPHAGE-XR) 500 MG 24 hr tablet Take by mouth. 05/02/14 05/02/15 Yes Historical Provider, MD  pantoprazole (PROTONIX) 40 MG tablet Take by mouth. 01/30/14 01/30/15 Yes Historical Provider, MD  azithromycin (ZITHROMAX Z-PAK) 250 MG tablet 2 tabs po once day 1, then 1 tab po qd for next 4 days 01/28/15   Norval Gable, MD  cyanocobalamin (,VITAMIN B-12,) 1000 MCG/ML injection 1,000 mcg monthly.    Historical Provider, MD  etodolac (LODINE) 500 MG tablet Take by mouth.    Historical Provider, MD  guaiFENesin-codeine 100-10 MG/5ML syrup Take 10 mLs by mouth every 6 (six) hours as needed for cough. 01/28/15   Norval Gable, MD  mupirocin cream (BACTROBAN) 2 % Apply 1 application topically 3 (three) times daily. 10/12/14   Jan Fireman, PA-C  ondansetron (ZOFRAN ODT) 8 MG disintegrating tablet Take 1 tablet (8 mg total) by mouth 2 (two) times daily. 10/27/14   Olen Cordial, NP  phentermine (ADIPEX-P) 37.5 MG tablet Take by mouth.    Historical Provider, MD  predniSONE (DELTASONE) 20 MG tablet Take 1 tablet (20 mg total) by mouth daily. 01/28/15   Norval Gable, MD   Meds Ordered and Administered this Visit   Medications  ipratropium-albuterol (DUONEB) 0.5-2.5 (3) MG/3ML  nebulizer solution 3 mL (not administered)    BP 144/64 mmHg  Pulse 81  Temp(Src) 98.8 F (37.1 C) (Oral)  Resp 16  Ht 5' 7.5" (1.715 m)  Wt 257 lb (116.574 kg)  BMI 39.63 kg/m2  SpO2 99% No data found.   Physical Exam  Constitutional: She appears well-developed and well-nourished. No distress.  HENT:  Head: Normocephalic.  Right Ear: Tympanic membrane, external ear and ear canal normal.  Left Ear: Tympanic membrane,  external ear and ear canal normal.  Nose: Nose normal.  Mouth/Throat: Uvula is midline, oropharynx is clear and moist and mucous membranes are normal.  Eyes: Conjunctivae and EOM are normal. Pupils are equal, round, and reactive to light. Right eye exhibits no discharge. Left eye exhibits no discharge. No scleral icterus.  Neck: Normal range of motion. Neck supple. No JVD present. No tracheal deviation present. No thyromegaly present.  Cardiovascular: Normal rate, regular rhythm, normal heart sounds and intact distal pulses.   No murmur heard. Pulmonary/Chest: Effort normal. No stridor. No respiratory distress. She has wheezes (bilateral inspiratory and expiratory wheezes and diffuse rhonchi). She has no rales. She exhibits no tenderness.  Musculoskeletal: She exhibits no edema.  Lymphadenopathy:    She has no cervical adenopathy.  Neurological: She is alert.  Skin: Skin is warm. No rash noted. She is not diaphoretic.  Nursing note and vitals reviewed.   ED Course  Procedures (including critical care time)  Labs Review Labs Reviewed - No data to display  Imaging Review No results found.   Visual Acuity Review  Right Eye Distance:   Left Eye Distance:   Bilateral Distance:    Right Eye Near:   Left Eye Near:    Bilateral Near:         MDM   1. Asthmatic bronchitis with exacerbation, mild intermittent    Discharge Medication List as of 01/28/2015  6:49 PM    START taking these medications   Details  azithromycin (ZITHROMAX Z-PAK) 250 MG tablet 2 tabs po once day 1, then 1 tab po qd for next 4 days, Normal    guaiFENesin-codeine 100-10 MG/5ML syrup Take 10 mLs by mouth every 6 (six) hours as needed for cough., Starting 01/28/2015, Until Discontinued, Print    predniSONE (DELTASONE) 20 MG tablet Take 1 tablet (20 mg total) by mouth daily., Starting 01/28/2015, Until Discontinued, Normal      1. diagnosis reviewed with patient 2. rx as per orders above; reviewed  possible side effects, interactions, risks and benefits  3. Patient given Duoneb nebulizer treatment x1 in clinic with improvement of symptoms 4. Continue home albuterol inhaler   5. Recommend supportive treatment with increased fluids 6. Follow-up prn if symptoms worsen or don't improve    Norval Gable, MD 01/28/15 2118

## 2015-01-28 NOTE — ED Notes (Signed)
Coughing (productive with yellow sputum), nasal congestion/ rhinorrhea, chest pain with cough, facial pain, plugged ear sensation and itchy ears bilaterally. Sx began with a sore throat on Sunday, which has improved.

## 2015-02-23 ENCOUNTER — Encounter: Payer: Self-pay | Admitting: Emergency Medicine

## 2015-02-23 ENCOUNTER — Ambulatory Visit (INDEPENDENT_AMBULATORY_CARE_PROVIDER_SITE_OTHER): Payer: 59

## 2015-02-23 ENCOUNTER — Ambulatory Visit
Admission: EM | Admit: 2015-02-23 | Discharge: 2015-02-23 | Disposition: A | Discharge status: Home / Self Care | Payer: 59 | Attending: Family Medicine | Admitting: Family Medicine

## 2015-02-23 DIAGNOSIS — R109 Unspecified abdominal pain: Secondary | ICD-10-CM

## 2015-02-23 LAB — BASIC METABOLIC PANEL
Anion gap: 8 (ref 5–15)
BUN: 10 mg/dL (ref 6–20)
CALCIUM: 9.3 mg/dL (ref 8.9–10.3)
CO2: 26 mmol/L (ref 22–32)
CREATININE: 0.7 mg/dL (ref 0.44–1.00)
Chloride: 106 mmol/L (ref 101–111)
GFR calc Af Amer: >60 mL/min (ref >60)
GFR calc non Af Amer: >60 mL/min (ref >60)
GLUCOSE: 85 mg/dL (ref 65–99)
Potassium: 4 mmol/L (ref 3.5–5.1)
Sodium: 140 mmol/L (ref 135–145)

## 2015-02-23 LAB — URINALYSIS COMPLETE WITH MICROSCOPIC (ARMC ONLY)
Bacteria, UA: NONE SEEN
Bilirubin Urine: NEGATIVE
Glucose, UA: NEGATIVE mg/dL
Hgb urine dipstick: NEGATIVE
Ketones, ur: NEGATIVE mg/dL
Leukocytes, UA: NEGATIVE
Nitrite: NEGATIVE
Protein, ur: NEGATIVE mg/dL
Specific Gravity, Urine: 1.015 (ref 1.005–1.030)
pH: 5.5 (ref 5.0–8.0)

## 2015-02-23 LAB — CBC WITH DIFFERENTIAL/PLATELET
BASOS PCT: 1 %
Basophils Absolute: 0.1 10*3/uL (ref 0–0.1)
Eosinophils Absolute: 0.2 10*3/uL (ref 0–0.7)
Eosinophils Relative: 2 %
HEMATOCRIT: 38.3 % (ref 35.0–47.0)
Hemoglobin: 12.7 g/dL (ref 12.0–16.0)
LYMPHS PCT: 33 %
Lymphs Abs: 3.5 10*3/uL (ref 1.0–3.6)
MCH: 30.2 pg (ref 26.0–34.0)
MCHC: 33.2 g/dL (ref 32.0–36.0)
MCV: 90.9 fL (ref 80.0–100.0)
Monocytes Absolute: 0.6 10*3/uL (ref 0.2–0.9)
Monocytes Relative: 6 %
NEUTROS ABS: 6.1 10*3/uL (ref 1.4–6.5)
Neutrophils Relative %: 58 %
Platelets: 264 10*3/uL (ref 150–440)
RBC: 4.21 MIL/uL (ref 3.80–5.20)
RDW: 13.7 % (ref 11.5–14.5)
WBC: 10.4 10*3/uL (ref 3.6–11.0)

## 2015-02-23 LAB — LIPASE, BLOOD: Lipase: 25 U/L (ref 11–51)

## 2015-02-23 MED ORDER — OXYCODONE-ACETAMINOPHEN 5-325 MG PO TABS: 1.0000 | ORAL_TABLET | Freq: Three times a day (TID) | ORAL | Status: DC | PRN

## 2015-02-23 MED ORDER — KETOROLAC TROMETHAMINE 60 MG/2ML IM SOLN
60.0000 mg | Freq: Once | INTRAMUSCULAR | Status: AC
  Administered 2015-02-23: 60 mg via INTRAMUSCULAR

## 2015-02-23 NOTE — ED Provider Notes (Signed)
[facility]  ____________________________________________  Time seen: Approximately 7:27 PM  I have reviewed the triage vital signs and the nursing notes.   HISTORY  Chief Complaint Abdominal Pain and Back Pain   HPI Tonya Bender is a 54 y.o. female presents with complaints of left flank pain since Saturday. Patient reports that this afternoon approximately 3 PM the pain increased. Patient states that Saturday 1 pain onset happened she was just sitting at home. Denies fall or trauma. Denies history of similar. States occasionally feels that the pain is wrapping around to her left groin. Patient also reports intermittent urinary frequency and urgency. Denies burning with urination. Denies blood in urine. Denies bowel changes, abnormal stool colors, blood in stool, dark stool.  States current left flank pain is 8 out of 10 sharp stabbing pain. States the pain is constant but with intermittent increased pain. Denies changes with position movements. Denies aggravating factors. Reports intermittent nausea, denies vomiting. Denies diarrhea. Denies fevers. Denies vaginal discharge, vaginal odor or vaginal complaints.   PCP: Raechel Ache   Past Medical History  Diagnosis Date  . Diabetes mellitus without complication ([place])   . Asthma     Patient Active Problem List   Diagnosis Date Noted  . ALLERGIC RHINITIS 04/16/2007  . ALLERGIC ASTHMA 04/16/2007    Past Surgical History  Procedure Laterality Date  . Abdominal hysterectomy    . Cholecystectomy      Current Outpatient Rx  Name  Route  Sig  Dispense  Refill  . acetaminophen (TYLENOL) 500 MG tablet      1-2 tablets by mouth  as needed for pain         . azithromycin (ZITHROMAX Z-PAK) 250 MG tablet      2 tabs po once day 1, then 1 tab po qd for next 4 days   6 each   0   . cyanocobalamin (,VITAMIN B-12,) 1000 MCG/ML injection      1,000 mcg monthly.         . estrogens, conjugated, (PREMARIN) 0.3 MG tablet  Oral   Take by mouth.         . etodolac (LODINE) 500 MG tablet   Oral   Take by mouth.         .           . glimepiride (AMARYL) 4 MG tablet   Oral   Take by mouth.         . =          . loratadine (CLARITIN) 10 MG tablet   Oral   Take 1 tablet (10 mg total) by mouth daily.         . metFORMIN (GLUCOPHAGE-XR) 500 MG 24 hr tablet   Oral   Take by mouth.         .           .           . EXPIRED: pantoprazole (PROTONIX) 40 MG tablet   Oral   Take by mouth.         . phentermine (ADIPEX-P) 37.5 MG tablet   Oral   Take by mouth.         .             Allergies Latex and Penicillins  History reviewed. No pertinent family history.  Social History Social History  Substance Use Topics  . Smoking status: Never Smoker   . Smokeless tobacco: None  .  Alcohol Use: No    Review of Systems Constitutional: No fever/chills Eyes: No visual changes. ENT: No sore throat. Cardiovascular: Denies chest pain. Respiratory: Denies shortness of breath. Gastrointestinal: No abdominal pain.  No nausea, no vomiting.  No diarrhea.  No constipation. Genitourinary:Positive for left flank pain. Positive for urinary urgency Musculoskeletal: Negative for back pain. Skin: Negative for rash. Neurological: Negative for headaches, focal weakness or numbness.  10-point ROS otherwise negative.  ____________________________________________   PHYSICAL EXAM:  VITAL SIGNS: ED Triage Vitals  Enc Vitals Group     BP 02/23/15 1800 153/88 mmHg     Pulse Rate 02/23/15 1800 77     Resp 02/23/15 1800 17     Temp 02/23/15 1800 97.4 F (36.3 C)     Temp Source 02/23/15 1800 Tympanic     SpO2 02/23/15 1800 98 %     Weight 02/23/15 1800 262 lb (118.842 kg)     Height 02/23/15 1800 5' 7.5" (1.715 m)     Head Cir --      Peak Flow --      Pain Score 02/23/15 1805 8     Pain Loc --      Pain Edu? --      Excl. in [place]? --     Constitutional: Alert and oriented. Well appearing  and in no acute distress. Eyes: Conjunctivae are normal. PERRL. EOMI. Head: Atraumatic.  Nose: No congestion/rhinnorhea.  Mouth/Throat: Mucous membranes are moist.  Oropharynx non-erythematous. Neck: No stridor.  No cervical spine tenderness to palpation. Hematological/Lymphatic/Immunilogical: No cervical lymphadenopathy. Cardiovascular: Normal rate, regular rhythm. Grossly normal heart sounds.  Good peripheral circulation. Respiratory: Normal respiratory effort.  No retractions. Lungs CTAB. no wheezes, rales or rhonchi. Good air movement.  Gastrointestinal: minimal tenderness to palpation left lower quadrant, abdomen otherwise soft and nontender.  Obese abdomen. Normal Bowel sounds. Moderate tenderness to palpation left CVA. No right CVA tenderness.  Musculoskeletal: No lower or upper extremity tenderness nor edema.  No joint effusions. Bilateral pedal pulses equal and easily palpated. No midline cervical, thoracic, or lumbar TTP. Bilateral straight leg test negative.  Neurologic:  Normal speech and language. No gross focal neurologic deficits are appreciated. No gait instability. Skin:  Skin is warm, dry and intact. No rash noted. Psychiatric: Mood and affect are normal. Speech and behavior are normal.  ____________________________________________   LABS (all labs ordered are listed, but only abnormal results are displayed)  Labs Reviewed  URINALYSIS COMPLETEWITH MICROSCOPIC ([place]) - Abnormal; Notable for the following:    Squamous Epithelial / LPF 0-5 (*)    All other components within normal limits  URINE CULTURE  CBC WITH DIFFERENTIAL/PLATELET  BASIC METABOLIC PANEL  LIPASE, BLOOD    RADIOLOGY  ABDOMEN - 1 VIEW  COMPARISON: 10/27/2014  FINDINGS: Scattered gas and stool throughout the colon. No small or large bowel distention. No radiopaque stones. Surgical clips in the right upper quadrant. Calcified phleboliths in the pelvis. Degenerative changes in the lumbar  spine with mild thoracolumbar scoliosis convex towards the right. No change since prior study.  IMPRESSION: Nonobstructive bowel gas pattern with scattered stool in the colon.   Electronically Signed By: Lucienne Capers M.D. On: 02/23/2015 20:03   I, Marylene Land, personally viewed and evaluated these images (plain radiographs) as part of my medical decision making.   ____________________________________________   INITIAL IMPRESSION / ASSESSMENT AND PLAN / ED COURSE  Pertinent labs & imaging results that were available during my care of the patient were reviewed by  me and considered in my medical decision making (see chart for details).   Well-appearing patient. No acute distress. Presents for the complaint of left flank pain 2 days. Denies aggravating factors. Denies fall or trauma. Also with accompanying urinary frequency and urgency. Denies history of similar. Denies history of kidney stones. Suspect left kidney stone. Urinalysis negative for bacteria, and negative for hemoglobin or  RBCs. Labs reviewed. WBC 10.4, and labs otherwise unremarkable. Awaiting KUB. 60 mg IM Toradol 1.  KUB reviewed, per radiology nonobstructive bowel gas pattern with scattered stool in the colon. Post IM Toradol, patient reevaluated. Abdomen soft and nontender. Minimal left CVA tenderness. Patient at this time sitting still reports pain is 0 out of 10 post Toradol. Patient reports feeling much improved. Reports has urinated since without any difficulties. Drinking fluids in room.  Discussed with patient in detail regarding suspicion of left nephrolithiasis. Counseled regarding increased fluid intake, when necessary Percocet as well as home when necessary Zofran as needed for nausea. Discussed close follow-up with primary care physician. Patient stable, afebrile and very well-appearing.. Discussed follow-up with PCP as regards for possible needing for further evaluation including CT scan of abdomen for  continued pain complaints. No ct scan ability at this facility at this time. Urine strainer sent home with patient.  Discussed follow up with Primary care physician tomorrow. Discussed follow up and return parameters including no resolution or any worsening concerns. Patient verbalized understanding and agreed to plan.   ____________________________________________   FINAL CLINICAL IMPRESSION(S) / ED DIAGNOSES  Final diagnoses:  Left flank pain       Marylene Land, NP 02/23/15 2136

## 2015-02-23 NOTE — ED Notes (Signed)
Patient c/o left sided lower abdominal pain and back pain since Saturday.  Patient reports nausea.  Patient denies V/D. Patient reports increase in urinary frequency and urgency.  Patient denies fever.

## 2015-02-23 NOTE — Discharge Instructions (Signed)
Take medication as prescribed as needed. Take home nausea medication as needed. Rest. Drink plenty of fluids.   Follow up tomorrow with your primary care physician as discussed. Close follow up is very important.   As discussed return to Urgent care or proceed to ER for increased pain, fever, inability to tolerate food or fluids, difficulty urinating or moving bowels, new or worsening concerns.   Flank Pain Flank pain refers to pain that is located on the side of the body between the upper abdomen and the back. The pain may occur over a short period of time (acute) or may be long-term or reoccurring (chronic). It may be mild or severe. Flank pain can be caused by many things. CAUSES  Some of the more common causes of flank pain include:  Muscle strains.   Muscle spasms.   A disease of your spine (vertebral disk disease).   A lung infection (pneumonia).   Fluid around your lungs (pulmonary edema).   A kidney infection.   Kidney stones.   A very painful skin rash caused by the chickenpox virus (shingles).   Gallbladder disease.  [place] care will depend on the cause of your pain. In general,  Rest as directed by your caregiver.  Drink enough fluids to keep your urine clear or pale yellow.  Only take over-the-counter or prescription medicines as directed by your caregiver. Some medicines may help relieve the pain.  Tell your caregiver about any changes in your pain.  Follow up with your caregiver as directed. SEEK IMMEDIATE MEDICAL CARE IF:   Your pain is not controlled with medicine.   You have new or worsening symptoms.  Your pain increases.   You have abdominal pain.   You have shortness of breath.   You have persistent nausea or vomiting.   You have swelling in your abdomen.   You feel faint or pass out.   You have blood in your urine.  You have a fever or persistent symptoms for more than 2-3 days.  You have a fever  and your symptoms suddenly get worse. MAKE SURE YOU:   Understand these instructions.  Will watch your condition.  Will get help right away if you are not doing well or get worse.   This information is not intended to replace advice given to you by your health care provider. Make sure you discuss any questions you have with your health care provider.   Document Released: 04/21/2005 Document Revised: 11/23/2011 Document Reviewed: 10/13/2011 Elsevier Interactive Patient Education [employer].

## 2015-02-25 LAB — URINE CULTURE
Culture: 6000
Special Requests: NORMAL

## 2015-08-15 IMAGING — CR LEFT FOURTH TOE
1 series · 3 of 3 positions shown · non-contrast
Comparison: None.

CLINICAL DATA: Pain and swelling in the left fourth toe. No known
injury.

EXAM:
LEFT FOURTH TOE

[Series 1: ap · 0.17mm/px · 3 of 3 slices shown]
[im 1/3]
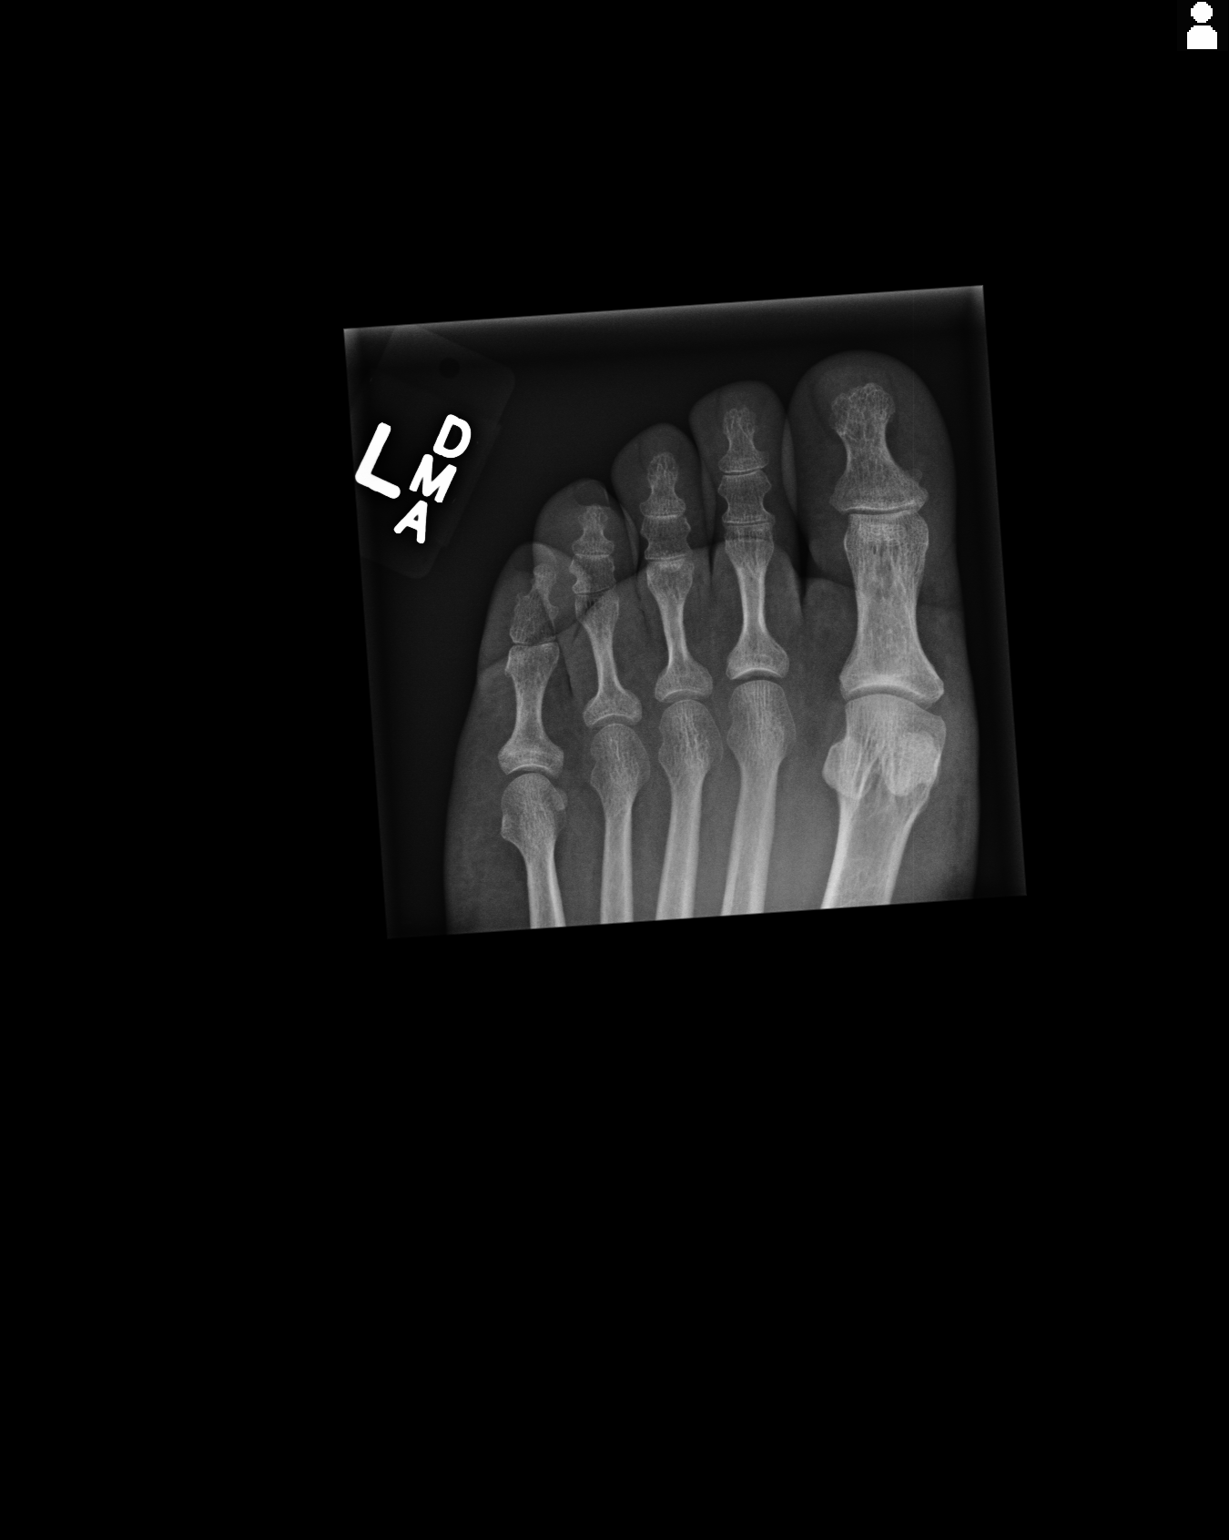
[im 2/3]
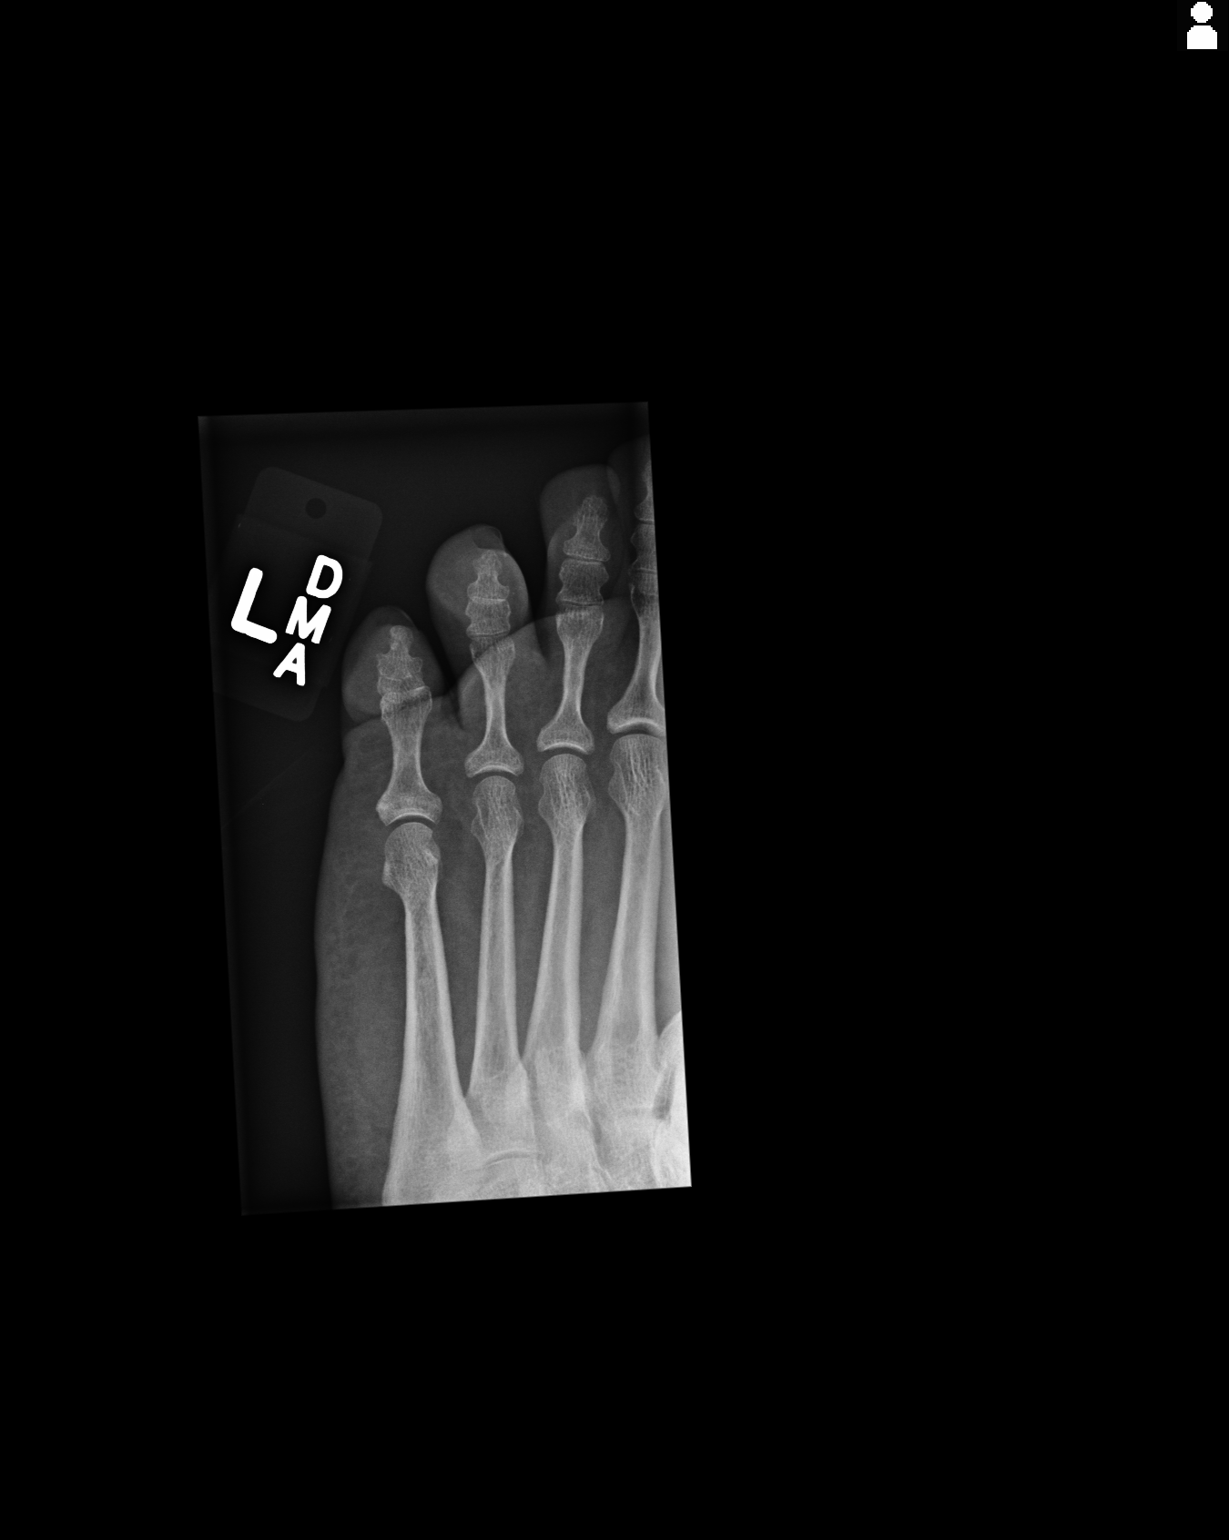
[im 3/3]
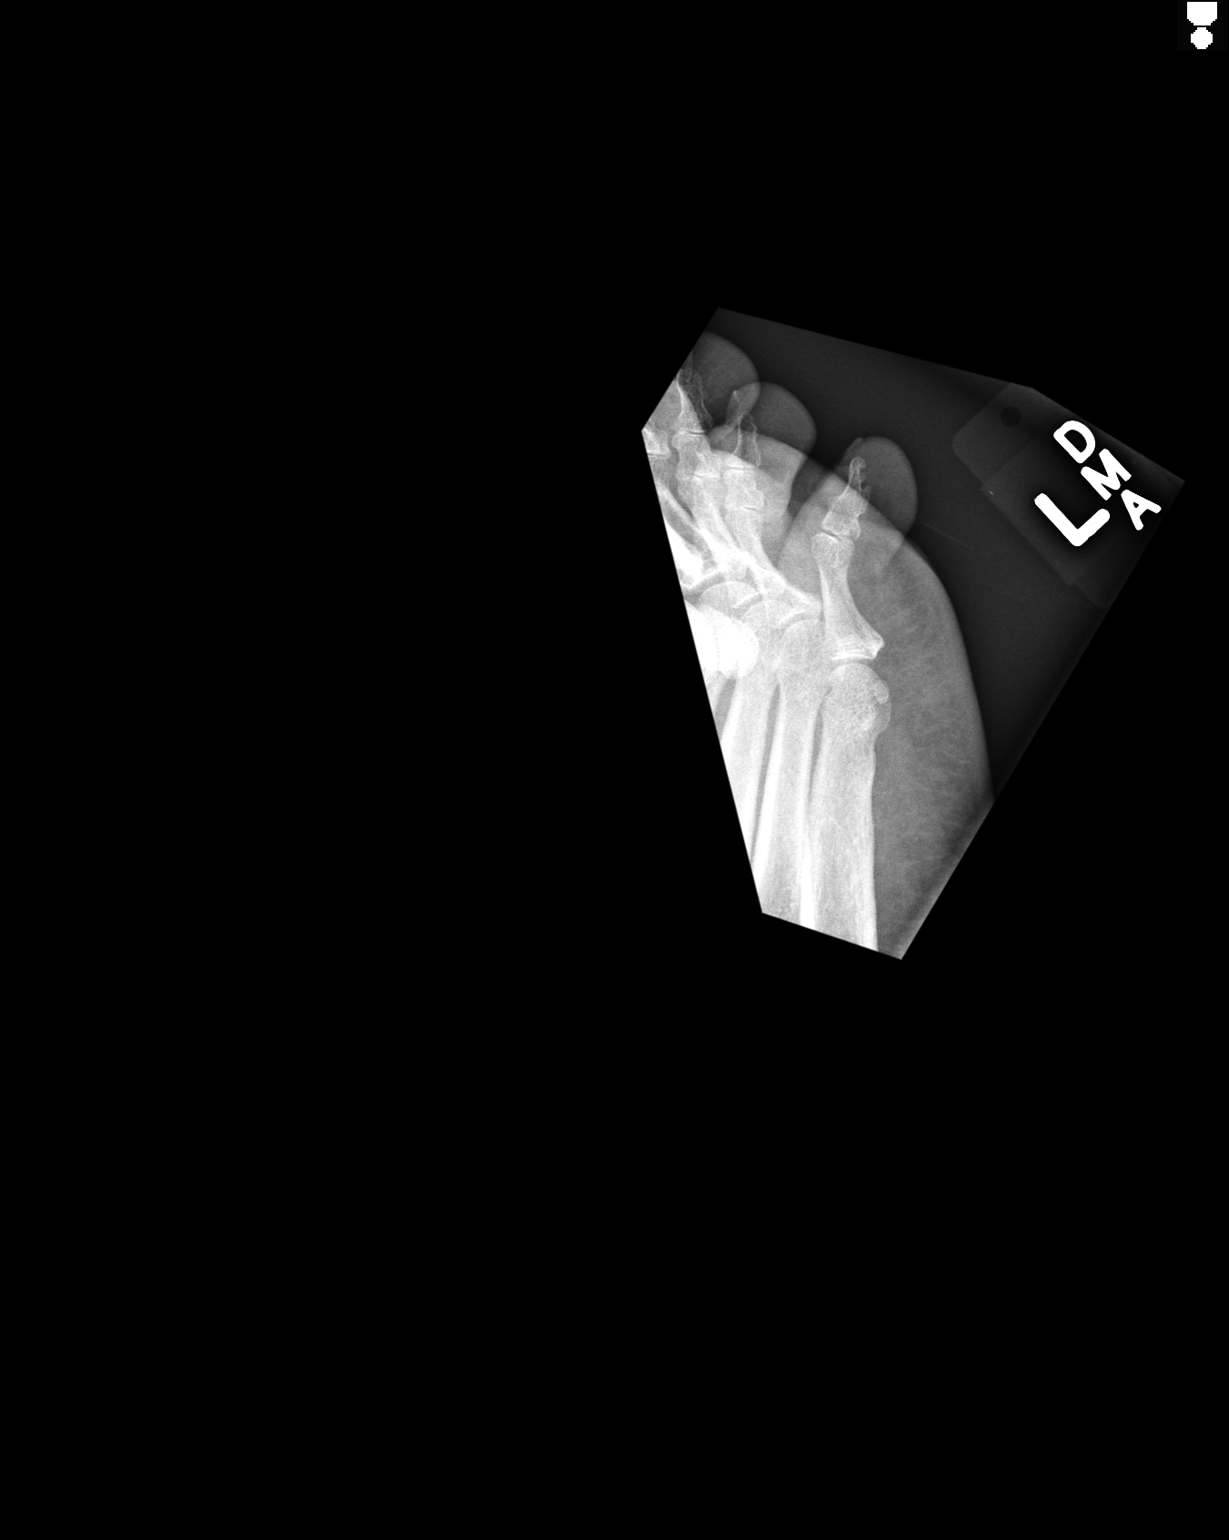

[3 of 3 positions shown; findings below may reference images not displayed]

FINDINGS: Soft tissue swelling about the distal aspect of the fourth digit. No
evidence of underlying fracture, malalignment or acute osseous
abnormality. Normal bony mineralization. No degenerative changes. No
evidence of inflammatory arthropathy. No radiopaque retained foreign
body.
IMPRESSION: Soft tissue swelling about the distal fourth digit without evidence
of acute osseous abnormality.

## 2015-11-25 ENCOUNTER — Other Ambulatory Visit: Payer: Self-pay | Admitting: Obstetrics and Gynecology

## 2015-11-25 DIAGNOSIS — Z1231 Encounter for screening mammogram for malignant neoplasm of breast: Secondary | ICD-10-CM

## 2015-12-15 ENCOUNTER — Ambulatory Visit
Admission: RE | Admit: 2015-12-15 | Discharge: 2015-12-15 | Disposition: A | Discharge status: Home / Self Care | Payer: 59 | Source: Ambulatory Visit | Attending: Obstetrics and Gynecology | Admitting: Obstetrics and Gynecology

## 2015-12-15 DIAGNOSIS — Z1231 Encounter for screening mammogram for malignant neoplasm of breast: Secondary | ICD-10-CM | POA: Insufficient documentation

## 2017-11-12 ENCOUNTER — Encounter: Payer: Self-pay | Admitting: Emergency Medicine

## 2017-11-12 ENCOUNTER — Other Ambulatory Visit: Payer: Self-pay

## 2017-11-12 ENCOUNTER — Ambulatory Visit
Admission: EM | Admit: 2017-11-12 | Discharge: 2017-11-12 | Disposition: A | Discharge status: Home / Self Care | Payer: BLUE CROSS/BLUE SHIELD | Attending: Family Medicine | Admitting: Family Medicine

## 2017-11-12 DIAGNOSIS — J069 Acute upper respiratory infection, unspecified: Secondary | ICD-10-CM

## 2017-11-12 DIAGNOSIS — J4521 Mild intermittent asthma with (acute) exacerbation: Secondary | ICD-10-CM | POA: Diagnosis not present

## 2017-11-12 MED ORDER — ALBUTEROL SULFATE HFA 108 (90 BASE) MCG/ACT IN AERS: 2.0000 | INHALATION_SPRAY | RESPIRATORY_TRACT | 0 refills | Status: DC | PRN

## 2017-11-12 MED ORDER — DOXYCYCLINE HYCLATE 100 MG PO CAPS: 100.0000 mg | ORAL_CAPSULE | Freq: Two times a day (BID) | ORAL | 0 refills | Status: DC

## 2017-11-12 MED ORDER — PREDNISONE 10 MG PO TABS: ORAL_TABLET | ORAL | 0 refills | Status: DC

## 2017-11-12 NOTE — Discharge Instructions (Addendum)
Take medication as prescribed. Rest. Drink plenty of fluids.  Continue over-the-counter cough and congestion medication.  Follow up with your primary care physician this week as needed. Return to Urgent care for new or worsening concerns.

## 2017-11-12 NOTE — ED Triage Notes (Signed)
Patient c/o sore throat that started on Tuesday. Sore throat lasted 2 days. She stated Wednesday she started coughing, runny nose and congestion. She also states she is wheezing now and has been using her inhaler x 3 days.

## 2017-11-12 NOTE — ED Provider Notes (Signed)
[facility] ____________________________________________  Time seen: Approximately 12:11 PM  I have reviewed the triage vital signs and the nursing notes.   HISTORY  Chief Complaint No chief complaint on file.   HPI Tonya Bender is a 57 y.o. female presenting for evaluation of 6 days of sore throat followed by cough and congestion.  Reports for the last several days that she has been hearing herself wheeze intermittently.  History of asthma and currently albuterol inhaler is out of date.  Some chills and body aches, denies known fevers.  States has felt tired.  Cough is somewhat productive of yellowish sputum.  Denies known sick contacts.  Has been using over-the-counter cough and congestion medicine including Alka-Seltzer was helped some but no resolution.  States has not been very active as felt very tired and rundown.  Denies other aggravating or alleviating factors.  Denies associated chest pain or shortness of breath.  Denies other aggravating alleviating factors. Denies recent sickness. Denies recent antibiotic use.  Ezequiel Kayser, MD: PCP    Past Medical History:  Diagnosis Date  . Asthma   . Diabetes mellitus without complication [facility])     Patient Active Problem List   Diagnosis Date Noted  . ALLERGIC RHINITIS 04/16/2007  . ALLERGIC ASTHMA 04/16/2007    Past Surgical History:  Procedure Laterality Date  . ABDOMINAL HYSTERECTOMY    . BREAST EXCISIONAL BIOPSY Bilateral 2010   NEG  . CHOLECYSTECTOMY       No current facility-administered medications for this encounter.   Current Outpatient Medications:  .  acetaminophen (TYLENOL) 500 MG tablet, 1-2 tablets by mouth  as needed for pain, Disp: , Rfl:  .  albuterol (PROVENTIL HFA;VENTOLIN HFA) 108 (90 Base) MCG/ACT inhaler, Inhale into the lungs., Disp: , Rfl:  .  glimepiride (AMARYL) 4 MG tablet, Take by mouth., Disp: , Rfl:  .  glucose blood (ONE TOUCH ULTRA TEST) test strip, Use once daily. As  directed, Disp: , Rfl:  .  metFORMIN (GLUCOPHAGE-XR) 500 MG 24 hr tablet, Take by mouth., Disp: , Rfl:  .  pantoprazole (PROTONIX) 40 MG tablet, Take by mouth., Disp: , Rfl:  .  albuterol (PROVENTIL HFA;VENTOLIN HFA) 108 (90 Base) MCG/ACT inhaler, Inhale 2 puffs into the lungs every 4 (four) hours as needed for wheezing., Disp: 1 Inhaler, Rfl: 0 .  doxycycline (VIBRAMYCIN) 100 MG capsule, Take 1 capsule (100 mg total) by mouth 2 (two) times daily., Disp: 20 capsule, Rfl: 0 .  glimepiride (AMARYL) 4 MG tablet, Take by mouth., Disp: , Rfl:  .  metFORMIN (GLUCOPHAGE-XR) 500 MG 24 hr tablet, Take by mouth., Disp: , Rfl:  .  phentermine (ADIPEX-P) 37.5 MG tablet, Take by mouth., Disp: , Rfl:  .  predniSONE (DELTASONE) 10 MG tablet, Start 60 mg po day one, then 50 mg po day two, taper by 10 mg daily until complete., Disp: 21 tablet, Rfl: 0  Allergies Latex and Penicillins  Family History  Problem Relation Age of Onset  . Breast cancer Paternal Grandmother     Social History Social History   Tobacco Use  . Smoking status: Never Smoker  . Smokeless tobacco: Never Used  Substance Use Topics  . Alcohol use: Yes    Alcohol/week: 0.0 standard drinks    Comment: occasionally   . Drug use: Never    Review of Systems Constitutional: As above. ENT: as above.  Cardiovascular: Denies chest pain. Respiratory: Denies shortness of breath. Gastrointestinal: No abdominal pain.  Musculoskeletal: Negative for  back pain. Skin: Negative for rash.   ____________________________________________   PHYSICAL EXAM:  VITAL SIGNS: ED Triage Vitals [11/12/17 1151]  Enc Vitals Group     BP 134/73     Pulse Rate 100     Resp 18     Temp 99.4 F (37.4 C)     Temp Source Oral     SpO2 97 %     Weight 270 lb (122.5 kg)     Height 5\' 7"  (1.702 m)     Head Circumference      Peak Flow      Pain Score 0     Pain Loc      Pain Edu?      Excl. in [place]?     Constitutional: Alert and oriented. Well  appearing and in no acute distress. Eyes: Conjunctivae are normal. PERRL. EOMI. Head: Atraumatic.  No frontal sinus tenderness palpation.  Minimal bilateral maxillary sinus tenderness palpation.  No swelling. No erythema.  Ears: no erythema, normal TMs bilaterally.   Nose:Nasal congestion with clear rhinorrhea  Mouth/Throat: Mucous membranes are moist.no pharyngeal erythema.  Mild bilateral tonsillar swelling.  No exudate. Neck: No stridor.  No cervical spine tenderness to palpation. Hematological/Lymphatic/Immunilogical: No cervical lymphadenopathy. Cardiovascular: Normal rate, regular rhythm. Grossly normal heart sounds.  Good peripheral circulation. Respiratory: Normal respiratory effort.  No retractions. Good air movement.  Mild scattered inspiratory and expiratory wheezes.  Scattered rhonchi, more right side.  Speaks in complete sentences. Musculoskeletal: Ambulatory with steady gait. No cervical, thoracic or lumbar tenderness to palpation.  No lower extremity edema noted. Neurologic:  Normal speech and language. No gait instability. Skin:  Skin appears warm, dry and intact. No rash noted. Psychiatric: Mood and affect are normal. Speech and behavior are normal.  ___________________________________________   LABS (all labs ordered are listed, but only abnormal results are displayed)  Labs Reviewed - No data to display   PROCEDURES Procedures    INITIAL IMPRESSION / ASSESSMENT AND PLAN / ED COURSE  Pertinent labs & imaging results that were available during my care of the patient were reviewed by me and considered in my medical decision making (see chart for details).  Overall well-appearing patient.  No acute distress.  Suspect recent viral upper respiratory infection.  However patient asthmatic and diabetic with wheezes and rhonchi, concern for secondary infection will start on oral doxycycline, prednisone taper and refill albuterol inhaler.  Continue over-the-counter cough and  congestion medications.  Discussed strict follow-up and return parameters.Discussed indication, risks and benefits of medications with patient.  Discussed follow up with Primary care physician this week. Discussed follow up and return parameters including no resolution or any worsening concerns. Patient verbalized understanding and agreed to plan.   ____________________________________________   FINAL CLINICAL IMPRESSION(S) / ED DIAGNOSES  Final diagnoses:  Acute upper respiratory infection  Intermittent asthma with acute exacerbation, unspecified asthma severity     ED Discharge Orders         Ordered    predniSONE (DELTASONE) 10 MG tablet     11/12/17 1210    doxycycline (VIBRAMYCIN) 100 MG capsule  2 times daily     11/12/17 1210    albuterol (PROVENTIL HFA;VENTOLIN HFA) 108 (90 Base) MCG/ACT inhaler  Every 4 hours PRN     11/12/17 1210           Note: This dictation was prepared with Dragon dictation along with smaller phrase technology. Any transcriptional errors that result from this process are unintentional.  Marylene Land, NP 11/12/17 1215

## 2019-06-26 ENCOUNTER — Other Ambulatory Visit: Payer: Self-pay | Admitting: Physical Medicine & Rehabilitation

## 2019-06-26 DIAGNOSIS — M5416 Radiculopathy, lumbar region: Secondary | ICD-10-CM

## 2019-07-04 ENCOUNTER — Ambulatory Visit: Payer: BC Managed Care – PPO

## 2019-07-29 ENCOUNTER — Other Ambulatory Visit: Payer: Self-pay | Admitting: Physical Medicine & Rehabilitation

## 2019-07-29 DIAGNOSIS — M5442 Lumbago with sciatica, left side: Secondary | ICD-10-CM

## 2019-11-04 ENCOUNTER — Other Ambulatory Visit: Payer: Self-pay

## 2019-11-04 ENCOUNTER — Ambulatory Visit
Admission: EM | Admit: 2019-11-04 | Discharge: 2019-11-04 | Disposition: A | Discharge status: Home / Self Care | Payer: BC Managed Care – PPO | Attending: Physician Assistant | Admitting: Physician Assistant

## 2019-11-04 ENCOUNTER — Ambulatory Visit (INDEPENDENT_AMBULATORY_CARE_PROVIDER_SITE_OTHER): Payer: BC Managed Care – PPO

## 2019-11-04 DIAGNOSIS — B349 Viral infection, unspecified: Secondary | ICD-10-CM

## 2019-11-04 DIAGNOSIS — R059 Cough, unspecified: Secondary | ICD-10-CM

## 2019-11-04 DIAGNOSIS — R05 Cough: Secondary | ICD-10-CM | POA: Insufficient documentation

## 2019-11-04 DIAGNOSIS — Z20822 Contact with and (suspected) exposure to covid-19: Secondary | ICD-10-CM | POA: Diagnosis present

## 2019-11-04 DIAGNOSIS — J45901 Unspecified asthma with (acute) exacerbation: Secondary | ICD-10-CM | POA: Diagnosis not present

## 2019-11-04 DIAGNOSIS — R0602 Shortness of breath: Secondary | ICD-10-CM | POA: Diagnosis not present

## 2019-11-04 LAB — SARS CORONAVIRUS 2 (TAT 6-24 HRS): SARS Coronavirus 2: NEGATIVE

## 2019-11-04 LAB — RSV: RSV (ARMC): NEGATIVE

## 2019-11-04 MED ORDER — BENZONATATE 100 MG PO CAPS: 200.0000 mg | ORAL_CAPSULE | Freq: Three times a day (TID) | ORAL | 0 refills | Status: AC | PRN

## 2019-11-04 MED ORDER — PREDNISONE 20 MG PO TABS: 40.0000 mg | ORAL_TABLET | Freq: Every day | ORAL | 0 refills | Status: AC

## 2019-11-04 MED ORDER — ALBUTEROL SULFATE HFA 108 (90 BASE) MCG/ACT IN AERS: 1.0000 | INHALATION_SPRAY | Freq: Four times a day (QID) | RESPIRATORY_TRACT | 0 refills | Status: AC | PRN

## 2019-11-04 NOTE — Discharge Instructions (Addendum)

## 2019-11-04 NOTE — ED Provider Notes (Signed)
[facility]    CSN: [id number] Arrival date & time: 11/04/19  2585      History   Chief Complaint Chief Complaint  Patient presents with   Cough   Nasal Congestion   Chest Pain    HPI Tonya Bender is a 59 y.o. female.   59 y/o female presents for 3 day history of sneezing, cough, congestion, fatigue. Admits to temps up to 99.2 degrees.  Admits to chest pains when she coughs. She says she has had some shortness of breath. Has been taking Tylenol Sinus and Flonase. Patient has a history of asthma and diabetes. She says she has not tried using her albuterol inhaler since she has had increased SOB since it is expired. Has been exposed to RSV through grandchild and someone at work tested positive for [place]. Patient fully vaccinated for COVID. Patient denies other concerns today.     Past Medical History:  Diagnosis Date   Asthma    Diabetes mellitus without complication [facility]     Patient Active Problem List   Diagnosis Date Noted   ALLERGIC RHINITIS 04/16/2007   ALLERGIC ASTHMA 04/16/2007    Past Surgical History:  Procedure Laterality Date   ABDOMINAL HYSTERECTOMY     BREAST EXCISIONAL BIOPSY Bilateral 2010   NEG   CHOLECYSTECTOMY      OB History   No obstetric history on file.      Home Medications    Prior to Admission medications   Medication Sig Start Date End Date Taking? Authorizing Provider  acetaminophen (TYLENOL) 500 MG tablet 1-2 tablets by mouth  as needed for pain   Yes [provider]  Dulaglutide (TRULICITY) 2.77 OE/4.2PN SOPN Inject into the skin. 04/17/19  Yes [provider]  glimepiride (AMARYL) 4 MG tablet Take by mouth. 07/28/17  Yes [provider]  glucose blood (ONE TOUCH ULTRA TEST) test strip Use once daily. As directed 11/11/15  Yes [provider]  metFORMIN (GLUCOPHAGE-XR) 500 MG 24 hr tablet Take by mouth. 07/17/15  Yes [provider]  albuterol (VENTOLIN HFA) 108 (90  Base) MCG/ACT inhaler Inhale 1-2 puffs into the lungs every 6 (six) hours as needed for wheezing or shortness of breath. 11/04/19 12/04/19  Laurene Footman B, PA-C  benzonatate (TESSALON) 100 MG capsule Take 2 capsules (200 mg total) by mouth 3 (three) times daily as needed for up to 7 days for cough. 11/04/19 11/11/19  Laurene Footman B, PA-C  doxycycline (VIBRAMYCIN) 100 MG capsule Take 1 capsule (100 mg total) by mouth 2 (two) times daily. 11/12/17   Marylene Land, NP  fluticasone (FLONASE) 50 MCG/ACT nasal spray Place into both nostrils. 09/13/19   [provider]  glimepiride (AMARYL) 4 MG tablet Take by mouth. 05/02/14 05/02/15  [provider]  metFORMIN (GLUCOPHAGE-XR) 500 MG 24 hr tablet Take by mouth. 05/02/14 05/02/15  [provider]  pantoprazole (PROTONIX) 40 MG tablet Take by mouth. 01/30/14 11/12/17  [provider]  phentermine (ADIPEX-P) 37.5 MG tablet Take by mouth.    [provider]  predniSONE (DELTASONE) 20 MG tablet Take 2 tablets (40 mg total) by mouth daily with breakfast for 5 days. 11/04/19 11/09/19  Danton Clap, PA-C    Family History Family History  Problem Relation Age of Onset   Breast cancer Paternal Grandmother     Social History Social History   Tobacco Use   Smoking status: Never Smoker   Smokeless tobacco: Never Used  Substance Use Topics  Alcohol use: Yes    Alcohol/week: 0.0 standard drinks    Comment: occasionally    Drug use: Never     Allergies   Latex and Penicillins   Review of Systems Review of Systems  Constitutional: Positive for fatigue. Negative for chills, diaphoresis and fever.  HENT: Positive for congestion, rhinorrhea and sinus pressure. Negative for ear pain, sinus pain and sore throat.   Respiratory: Positive for cough and shortness of breath. Negative for wheezing.   Cardiovascular: Positive for chest pain (with cough).  Gastrointestinal: Negative for abdominal pain, nausea and  vomiting.  Musculoskeletal: Negative for arthralgias and myalgias.  Skin: Negative for rash.  Neurological: Negative for weakness and headaches.  Hematological: Negative for adenopathy.     Physical Exam Triage Vital Signs ED Triage Vitals  Enc Vitals Group     BP 11/04/19 0848 124/70     Pulse Rate 11/04/19 0848 (!) 111     Resp 11/04/19 0848 20     Temp 11/04/19 0848 99.4 F (37.4 C)     Temp Source 11/04/19 0848 Oral     SpO2 11/04/19 0848 97 %     Weight 11/04/19 0852 275 lb (124.7 kg)     Height 11/04/19 0852 5' 7.5" (1.715 m)     Head Circumference --      Peak Flow --      Pain Score 11/04/19 0852 1     Pain Loc --      Pain Edu? --      Excl. in [place]? --    No data found.  Updated Vital Signs BP 124/70 (BP Location: Left Arm)    Pulse (!) 111    Temp 99.4 F (37.4 C) (Oral)    Resp 20    Ht 5' 7.5" (1.715 m)    Wt 275 lb (124.7 kg)    SpO2 97%    BMI 42.44 kg/m       Physical Exam Vitals and nursing note reviewed.  Constitutional:      General: She is not in acute distress.    Appearance: Normal appearance. She is well-developed. She is obese. She is not ill-appearing or toxic-appearing.  HENT:     Head: Normocephalic and atraumatic.     Right Ear: A middle ear effusion is present.     Left Ear: A middle ear effusion is present.     Nose: Congestion and rhinorrhea present.     Comments: +nasal mucosal edema    Mouth/Throat:     Mouth: Mucous membranes are moist.     Pharynx: Oropharynx is clear.  Eyes:     General: No scleral icterus.       Right eye: No discharge.        Left eye: No discharge.     Conjunctiva/sclera: Conjunctivae normal.  Cardiovascular:     Rate and Rhythm: Regular rhythm. Tachycardia present.     Heart sounds: Normal heart sounds.  Pulmonary:     Effort: Pulmonary effort is normal. No respiratory distress.     Breath sounds: Rhonchi (few scattered rhonchi throughout) present.  Musculoskeletal:     Cervical back: Neck supple.    Skin:    General: Skin is dry.  Neurological:     General: No focal deficit present.     Mental Status: She is alert. Mental status is at baseline.     Motor: No weakness.     Gait: Gait normal.  Psychiatric:  Mood and Affect: Mood normal.        Behavior: Behavior normal.        Thought Content: Thought content normal.      UC Treatments / Results  Labs (all labs ordered are listed, but only abnormal results are displayed) Labs Reviewed  RSV  SARS CORONAVIRUS 2 (TAT 6-24 HRS)    EKG   Radiology DG Chest 2 View  Result Date: 11/04/2019 CLINICAL DATA:  Shortness of breath. EXAM: CHEST - 2 VIEW COMPARISON:  October 31, 2013. FINDINGS: The heart size and mediastinal contours are within normal limits. Both lungs are clear. No pneumothorax or pleural effusion is noted. The visualized skeletal structures are unremarkable. IMPRESSION: No active cardiopulmonary disease. Electronically Signed   By: Marijo Conception M.D.   On: 11/04/2019 10:03    Procedures Procedures (including critical care time)  Medications Ordered in UC Medications - No data to display  Initial Impression / Assessment and Plan / UC Course  I have reviewed the triage vital signs and the nursing notes.  Pertinent labs & imaging results that were available during my care of the patient were reviewed by me and considered in my medical decision making (see chart for details).   59 y/o female with history of asthma diabetes presenting for congestion, cough, chest pain on coughing, mild shortness of breath (says it is 2/10 in severity) x 3 days. Has had RSV and COVID exposure Labs obtained for RSV and COVID. RSV negative. Chest x-ray ordered due to her complaints and history of asthma and diabetes. CXR independently reviewed by me- normal. Overread normal imaging. Suspect viral illness and asthma exacerbation. Advised prednisone, tessalon perles, and albuterol. CDC guidelines for COVID discussed if positive. ED  precautions reviewed.  Final Clinical Impressions(s) / UC Diagnoses   Final diagnoses:  Cough  Shortness of breath  Asthma with acute exacerbation, unspecified asthma severity, unspecified whether persistent  Viral illness  Close exposure to COVID-19 virus     Discharge Instructions     URI/COLD SYMPTOMS: Your exam today is consistent with a viral illness. Antibiotics are not indicated at this time. Use medications as directed, including cough syrup, nasal saline, and decongestants. Your symptoms should improve over the next few days and resolve within 7-10 days. Increase rest and fluids. F/u if symptoms worsen or predominate such as sore throat, ear pain, productive cough, shortness of breath, or if you develop high fevers or worsening fatigue over the next several days.    *begin inhaler *start pednisone *tessalon perles to help with cough  You have received COVID testing today either for positive exposure, concerning symptoms that could be related to COVID infection, screening purposes, or re-testing after confirmed positive.  Your test obtained today checks for active viral infection in the last 1-2 weeks. If your test is negative now, you can still test positive later. So, if you do develop symptoms you should either get re-tested and/or isolate x 10 days. Please follow CDC guidelines.  While Rapid antigen tests come back in 15-20 minutes, send out PCR/molecular test results typically come back within 24 hours. In the mean time, if you are symptomatic, assume this could be a positive test and treat/monitor yourself as if you do have COVID.   We will call with test results. Please download the MyChart app and set up a profile to access test results.   If symptomatic, go home and rest. Push fluids. Take Tylenol as needed for discomfort. Gargle warm salt water.  Throat lozenges. Take Mucinex DM or Robitussin for cough. Humidifier in bedroom to ease coughing. Warm showers. Also review the  COVID handout for more information.  COVID-19 INFECTION: The incubation period of COVID-19 is approximately 14 days after exposure, with most symptoms developing in roughly 4-5 days. Symptoms may range in severity from mild to critically severe. Roughly 80% of those infected will have mild symptoms. People of any age may become infected with COVID-19 and have the ability to transmit the virus. The most common symptoms include: fever, fatigue, cough, body aches, headaches, sore throat, nasal congestion, shortness of breath, nausea, vomiting, diarrhea, changes in smell and/or taste.    COURSE OF ILLNESS Some patients may begin with mild disease which can progress quickly into critical symptoms. If your symptoms are worsening please call ahead to the Emergency Department and proceed there for further treatment. Recovery time appears to be roughly 1-2 weeks for mild symptoms and 3-6 weeks for severe disease.   GO IMMEDIATELY TO ER FOR FEVER YOU ARE UNABLE TO GET DOWN WITH TYLENOL, BREATHING PROBLEMS, CHEST PAIN, FATIGUE, LETHARGY, INABILITY TO EAT OR DRINK, ETC  QUARANTINE AND ISOLATION: To help decrease the spread of COVID-19 please remain isolated if you have COVID infection or are highly suspected to have COVID infection. This means -stay home and isolate to one room in the home if you live with others. Do not share a bed or bathroom with others while ill, sanitize and wipe down all countertops and keep common areas clean and disinfected. You may discontinue isolation if you have a mild case and are asymptomatic 10 days after symptom onset as long as you have been fever free >24 hours without having to take Motrin or Tylenol. If your case is more severe (meaning you develop pneumonia or are admitted in the hospital), you may have to isolate longer.   If you have been in close contact (within 6 feet) of someone diagnosed with COVID 19, you are advised to quarantine in your home for 14 days as symptoms can  develop anywhere from 2-14 days after exposure to the virus. If you develop symptoms, you  must isolate.  Most current guidelines for COVID after exposure -isolate 10 days if you ARE NOT tested for COVID as long as symptoms do not develop -isolate 7 days if you are tested and remain asymptomatic -You do not necessarily need to be tested for COVID if you have + exposure and        develop   symptoms. Just isolate at home x10 days from symptom onset During this global pandemic, CDC advises to practice social distancing, try to stay at least 28ft away from others at all times. Wear a face covering. Wash and sanitize your hands regularly and avoid going anywhere that is not necessary.  KEEP IN MIND THAT THE COVID TEST IS NOT 100% ACCURATE AND YOU SHOULD STILL DO EVERYTHING TO PREVENT POTENTIAL SPREAD OF VIRUS TO OTHERS (WEAR MASK, WEAR GLOVES, [place] HANDS AND SANITIZE REGULARLY). IF INITIAL TEST IS NEGATIVE, THIS MAY NOT MEAN YOU ARE DEFINITELY NEGATIVE. MOST ACCURATE TESTING IS DONE 5-7 DAYS AFTER EXPOSURE.   It is not advised by CDC to get re-tested after receiving a positive COVID test since you can still test positive for weeks to months after you have already cleared the virus.   *If you have not been vaccinated for COVID, I strongly suggest you consider getting vaccinated as long as there are no contraindications.  ED Prescriptions    Medication Sig Dispense Auth. Provider   predniSONE (DELTASONE) 20 MG tablet Take 2 tablets (40 mg total) by mouth daily with breakfast for 5 days. 10 tablet Laurene Footman B, PA-C   albuterol (VENTOLIN HFA) 108 (90 Base) MCG/ACT inhaler Inhale 1-2 puffs into the lungs every 6 (six) hours as needed for wheezing or shortness of breath. 8 g Laurene Footman B, PA-C   benzonatate (TESSALON) 100 MG capsule Take 2 capsules (200 mg total) by mouth 3 (three) times daily as needed for up to 7 days for cough. 21 capsule Danton Clap, PA-C     PDMP not reviewed this  encounter.   Danton Clap, PA-C 11/04/19 1015

## 2019-11-04 NOTE — ED Triage Notes (Signed)
Patient in today w/ cough, chest congestion, sinus congestion and drainage, low grade fever. Patient states she was exposed to RSV last week and sx onset for her was Friday.

## 2020-06-15 ENCOUNTER — Other Ambulatory Visit: Payer: Self-pay | Admitting: Gastroenterology

## 2020-06-15 ENCOUNTER — Other Ambulatory Visit (HOSPITAL_COMMUNITY): Payer: Self-pay | Admitting: Gastroenterology

## 2020-06-15 DIAGNOSIS — R1032 Left lower quadrant pain: Secondary | ICD-10-CM

## 2020-06-26 ENCOUNTER — Other Ambulatory Visit: Payer: Self-pay

## 2020-06-26 ENCOUNTER — Ambulatory Visit (HOSPITAL_COMMUNITY)
Admission: RE | Admit: 2020-06-26 | Discharge: 2020-06-26 | Disposition: A | Discharge status: Home / Self Care | Payer: BC Managed Care – PPO | Source: Ambulatory Visit | Attending: Gastroenterology | Admitting: Gastroenterology

## 2020-06-26 DIAGNOSIS — R1032 Left lower quadrant pain: Secondary | ICD-10-CM | POA: Diagnosis not present

## 2020-08-28 ENCOUNTER — Ambulatory Visit: Payer: BC Managed Care – PPO | Admitting: Certified Registered"

## 2020-08-28 ENCOUNTER — Encounter
Admission: RE | Disposition: A | Discharge status: Home / Self Care | Payer: Self-pay | Source: Home / Self Care | Attending: Gastroenterology

## 2020-08-28 ENCOUNTER — Ambulatory Visit
Admission: RE | Admit: 2020-08-28 | Discharge: 2020-08-28 | Disposition: A | Discharge status: Home / Self Care | Payer: BC Managed Care – PPO | Attending: Gastroenterology | Admitting: Gastroenterology

## 2020-08-28 DIAGNOSIS — Z800 Family history of malignant neoplasm of digestive organs: Secondary | ICD-10-CM | POA: Diagnosis not present

## 2020-08-28 DIAGNOSIS — Z7951 Long term (current) use of inhaled steroids: Secondary | ICD-10-CM | POA: Insufficient documentation

## 2020-08-28 DIAGNOSIS — D122 Benign neoplasm of ascending colon: Secondary | ICD-10-CM | POA: Diagnosis not present

## 2020-08-28 DIAGNOSIS — Z1211 Encounter for screening for malignant neoplasm of colon: Secondary | ICD-10-CM | POA: Insufficient documentation

## 2020-08-28 DIAGNOSIS — Z79899 Other long term (current) drug therapy: Secondary | ICD-10-CM | POA: Insufficient documentation

## 2020-08-28 DIAGNOSIS — Z7984 Long term (current) use of oral hypoglycemic drugs: Secondary | ICD-10-CM | POA: Insufficient documentation

## 2020-08-28 DIAGNOSIS — E119 Type 2 diabetes mellitus without complications: Secondary | ICD-10-CM | POA: Insufficient documentation

## 2020-08-28 DIAGNOSIS — K219 Gastro-esophageal reflux disease without esophagitis: Secondary | ICD-10-CM | POA: Insufficient documentation

## 2020-08-28 DIAGNOSIS — J45909 Unspecified asthma, uncomplicated: Secondary | ICD-10-CM | POA: Insufficient documentation

## 2020-08-28 DIAGNOSIS — K5730 Diverticulosis of large intestine without perforation or abscess without bleeding: Secondary | ICD-10-CM | POA: Insufficient documentation

## 2020-08-28 DIAGNOSIS — K640 First degree hemorrhoids: Secondary | ICD-10-CM | POA: Diagnosis not present

## 2020-08-28 DIAGNOSIS — Z880 Allergy status to penicillin: Secondary | ICD-10-CM | POA: Diagnosis not present

## 2020-08-28 DIAGNOSIS — Z91040 Latex allergy status: Secondary | ICD-10-CM | POA: Diagnosis not present

## 2020-08-28 HISTORY — PX: COLONOSCOPY WITH PROPOFOL: SHX5780

## 2020-08-28 LAB — GLUCOSE, CAPILLARY: Glucose-Capillary: 160 mg/dL — ABNORMAL HIGH (ref 70–99)

## 2020-08-28 SURGERY — COLONOSCOPY WITH PROPOFOL
Anesthesia: General

## 2020-08-28 MED ORDER — PROPOFOL 10 MG/ML IV BOLUS
INTRAVENOUS | Status: AC
  Filled 2020-08-28: qty 20

## 2020-08-28 MED ORDER — PROPOFOL 10 MG/ML IV BOLUS
INTRAVENOUS | Status: DC | PRN
  Administered 2020-08-28: 120 mg via INTRAVENOUS
  Administered 2020-08-28: 50 mg via INTRAVENOUS

## 2020-08-28 MED ORDER — PROPOFOL 500 MG/50ML IV EMUL
INTRAVENOUS | Status: DC | PRN
  Administered 2020-08-28: 150 ug/kg/min via INTRAVENOUS

## 2020-08-28 MED ORDER — DEXMEDETOMIDINE (PRECEDEX) IN NS 20 MCG/5ML (4 MCG/ML) IV SYRINGE
PREFILLED_SYRINGE | INTRAVENOUS | Status: DC | PRN
  Administered 2020-08-28: 12 ug via INTRAVENOUS

## 2020-08-28 MED ORDER — SODIUM CHLORIDE 0.9 % IV SOLN
INTRAVENOUS | Status: DC
  Administered 2020-08-28: 1000 mL via INTRAVENOUS

## 2020-08-28 MED ORDER — LIDOCAINE HCL (CARDIAC) PF 100 MG/5ML IV SOSY
PREFILLED_SYRINGE | INTRAVENOUS | Status: DC | PRN
  Administered 2020-08-28: 40 mg via INTRAVENOUS

## 2020-08-28 NOTE — Transfer of Care (Signed)
Immediate Anesthesia Transfer of Care Note  Patient: Tonya Bender  Procedure(s) Performed: COLONOSCOPY WITH PROPOFOL  Patient Location: PACU  Anesthesia Type:General  Level of Consciousness: drowsy  Airway & Oxygen Therapy: Patient Spontanous Breathing  Post-op Assessment: Report given to RN and Post -op Vital signs reviewed and stable  Post vital signs: stable  Last Vitals:  Vitals Value Taken Time  BP 125/69 08/28/20 1352  Temp 36.4 C 08/28/20 1352  Pulse 105 08/28/20 1355  Resp 30 08/28/20 1355  SpO2 97 % 08/28/20 1355  Vitals shown include unvalidated device data.  Last Pain:  Vitals:   08/28/20 1352  TempSrc: Temporal  PainSc: Asleep         Complications: No notable events documented.

## 2020-08-28 NOTE — Anesthesia Postprocedure Evaluation (Signed)
Anesthesia Post Note  Patient: Tonya Bender  Procedure(s) Performed: COLONOSCOPY WITH PROPOFOL  Patient location during evaluation: Endoscopy Anesthesia Type: General Level of consciousness: awake and alert Pain management: pain level controlled Vital Signs Assessment: post-procedure vital signs reviewed and stable Respiratory status: spontaneous breathing, nonlabored ventilation, respiratory function stable and patient connected to nasal cannula oxygen Cardiovascular status: blood pressure returned to baseline and stable Postop Assessment: no apparent nausea or vomiting Anesthetic complications: no   No notable events documented.   Last Vitals:  Vitals:   08/28/20 1412 08/28/20 1422  BP: (!) 157/92 (!) 153/96  Pulse: 95 91  Resp: (!) 23 20  Temp:    SpO2: 100% 100%    Last Pain:  Vitals:   08/28/20 1422  TempSrc:   PainSc: 0-No pain                 Arita Miss

## 2020-08-28 NOTE — Op Note (Signed)
[facility]enter[facility] Patient Name: Tonya Bender Procedure Date: 08/28/2020 1:18 PM MRN: [id number] Account #: [id number] Date of Birth: 1960-12-26 Admit Type: Outpatient Age: 60 Room: [facility] ENDO ROOM 3 Gender: Female Note Status: Finalized Procedure:             Colonoscopy Indications:           High risk colon cancer surveillance: Personal history                         of non-advanced adenoma, Family history of colon                         cancer in a first-degree relative before age 55 years Providers:             Andrey Farmer MD, MD Referring MD:          Christena Flake. Raechel Ache, MD (Referring MD) Medicines:             Monitored Anesthesia Care Complications:         No immediate complications. Estimated blood loss:                         Minimal. Procedure:             Pre-Anesthesia Assessment:                        - Prior to the procedure, a History and Physical was                         performed, and patient medications and allergies were                         reviewed. The patient is competent. The risks and                         benefits of the procedure and the sedation options and                         risks were discussed with the patient. All questions                         were answered and informed consent was obtained.                         Patient identification and proposed procedure were                         verified by the physician, the nurse, the anesthetist                         and the technician in the endoscopy suite. Mental                         Status Examination: alert and oriented. Airway                         Examination: normal oropharyngeal airway and neck  mobility. Respiratory Examination: clear to                         auscultation. CV Examination: normal. Prophylactic                         Antibiotics: The patient does not require prophylactic                          antibiotics. Prior Anticoagulants: The patient has                         taken no previous anticoagulant or antiplatelet                         agents. ASA Grade Assessment: II - A patient with mild                         systemic disease. After reviewing the risks and                         benefits, the patient was deemed in satisfactory                         condition to undergo the procedure. The anesthesia                         plan was to use monitored anesthesia care (MAC).                         Immediately prior to administration of medications,                         the patient was re-assessed for adequacy to receive                         sedatives. The heart rate, respiratory rate, oxygen                         saturations, blood pressure, adequacy of pulmonary                         ventilation, and response to care were monitored                         throughout the procedure. The physical status of the                         patient was re-assessed after the procedure.                        After obtaining informed consent, the colonoscope was                         passed under direct vision. Throughout the procedure,                         the patient's blood pressure, pulse, and oxygen  saturations were monitored continuously. The                         Colonoscope was introduced through the anus and                         advanced to the the cecum, identified by appendiceal                         orifice and ileocecal valve. The colonoscopy was                         performed without difficulty. The patient tolerated                         the procedure well. The quality of the bowel                         preparation was good. Findings:      The perianal and digital rectal examinations were normal.      Two sessile polyps were found in the ascending colon. The polyps were 1       to 3 mm in size. These polyps were  removed with a cold snare. Resection       and retrieval were complete. Estimated blood loss was minimal.      Scattered small-mouthed diverticula were found in the sigmoid colon,       descending colon, transverse colon, hepatic flexure and ascending colon.      Internal hemorrhoids were found during retroflexion. The hemorrhoids       were Grade I (internal hemorrhoids that do not prolapse).      The exam was otherwise without abnormality on direct and retroflexion       views. Impression:            - Two 1 to 3 mm polyps in the ascending colon, removed                         with a cold snare. Resected and retrieved.                        - Diverticulosis in the sigmoid colon, in the                         descending colon, in the transverse colon, at the                         hepatic flexure and in the ascending colon.                        - Internal hemorrhoids.                        - The examination was otherwise normal on direct and                         retroflexion views. Recommendation:        - Discharge patient to home.                        -  Resume previous diet.                        - Continue present medications.                        - Await pathology results.                        - Repeat colonoscopy in 5 years for surveillance.                        - Return to referring physician as previously                         scheduled. Procedure Code(s):     --- Professional ---                        [phone], Colonoscopy, flexible; with removal of                         tumor(s), polyp(s), or other lesion(s) by snare                         technique Diagnosis Code(s):     --- Professional ---                        Z86.010, Personal history of colonic polyps                        K63.5, Polyp of colon                        K64.0, First degree hemorrhoids                        Z80.0, Family history of malignant neoplasm of                          digestive organs                        K57.30, Diverticulosis of large intestine without                         perforation or abscess without bleeding CPT copyright 2019 [employer]. All rights reserved. The codes documented in this report are preliminary and upon coder review may  be revised to meet current compliance requirements. Andrey Farmer MD, MD 08/28/2020 1:52:41 PM Number of Addenda: 0 Note Initiated On: 08/28/2020 1:18 PM Scope Withdrawal Time: 0 hours 11 minutes 55 seconds  Total Procedure Duration: 0 hours 22 minutes 52 seconds  Estimated Blood Loss:  Estimated blood loss was minimal.      [facility]

## 2020-08-28 NOTE — H&P (Signed)
Outpatient short stay form Pre-procedure 08/28/2020 1:18 PM Raylene Miyamoto MD, MPH  Primary Physician: Dr. Raechel Ache  Reason for visit:  Surveillance colonoscopy  History of present illness:   60 y/o lady with history of DM II, GERD, and asthma here for surveillance colonoscopy. History of cholecystectomy and hysterectomy. No blood thinners. Son had colon cancer at the age of 70. No new symptoms.    Current Facility-Administered Medications:    0.9 %  sodium chloride infusion, , Intravenous, Continuous, Garison Genova, Hilton Cork, MD, Last Rate: 20 mL/hr at 08/28/20 1317, Continued from Pre-op at 08/28/20 1317  Medications Prior to Admission  Medication Sig Dispense Refill Last Dose   acetaminophen (TYLENOL) 500 MG tablet 1-2 tablets by mouth  as needed for pain   Past Week   doxycycline (VIBRAMYCIN) 100 MG capsule Take 1 capsule (100 mg total) by mouth 2 (two) times daily. 20 capsule 0 Past Week   Dulaglutide (TRULICITY) 7.86 VE/7.2CN SOPN Inject into the skin.   Past Week   fluticasone (FLONASE) 50 MCG/ACT nasal spray Place into both nostrils.   Past Week   glimepiride (AMARYL) 4 MG tablet Take by mouth.   Past Week   glucose blood test strip Use once daily. As directed   Past Week   metFORMIN (GLUCOPHAGE-XR) 500 MG 24 hr tablet Take by mouth.   Past Week   phentermine (ADIPEX-P) 37.5 MG tablet Take by mouth.   Past Week   albuterol (VENTOLIN HFA) 108 (90 Base) MCG/ACT inhaler Inhale 1-2 puffs into the lungs every 6 (six) hours as needed for wheezing or shortness of breath. 8 g 0    glimepiride (AMARYL) 4 MG tablet Take by mouth.      metFORMIN (GLUCOPHAGE-XR) 500 MG 24 hr tablet Take by mouth.      pantoprazole (PROTONIX) 40 MG tablet Take by mouth.        Allergies  Allergen Reactions   Latex Other (See Comments)   Penicillins Nausea And Vomiting    nausea: N/V/D     Past Medical History:  Diagnosis Date   Asthma    Diabetes mellitus without complication ([place])     Review of  systems:  Otherwise negative.    Physical Exam  Gen: Alert, oriented. Appears stated age.  HEENT: PERRLA. Lungs: No respiratory distress CV: RRR Abd: soft, benign, no masses Ext: No edema    Planned procedures: Proceed with colonoscopy. The patient understands the nature of the planned procedure, indications, risks, alternatives and potential complications including but not limited to bleeding, infection, perforation, damage to internal organs and possible oversedation/side effects from anesthesia. The patient agrees and gives consent to proceed.  Please refer to procedure notes for findings, recommendations and patient disposition/instructions.     Raylene Miyamoto MD, MPH Gastroenterology 08/28/2020  1:18 PM

## 2020-08-28 NOTE — Interval H&P Note (Signed)
History and Physical Interval Note:  08/28/2020 1:20 PM  Tonya Bender  has presented today for surgery, with the diagnosis of LLQ PAIN,HX.OF COLON POLYPS.  The various methods of treatment have been discussed with the patient and family. After consideration of risks, benefits and other options for treatment, the patient has consented to  Procedure(s): COLONOSCOPY WITH PROPOFOL (N/A) as a surgical intervention.  The patient's history has been reviewed, patient examined, no change in status, stable for surgery.  I have reviewed the patient's chart and labs.  Questions were answered to the patient's satisfaction.     Lesly Rubenstein  Ok to proceed with colonoscopy

## 2020-08-28 NOTE — Anesthesia Preprocedure Evaluation (Signed)
Anesthesia Evaluation  Patient identified by MRN, date of birth, ID band Patient awake    Reviewed: Allergy & Precautions, NPO status , Patient's Chart, lab work & pertinent test results  History of Anesthesia Complications Negative for: history of anesthetic complications  Airway Mallampati: II  TM Distance: >3 FB Neck ROM: Full    Dental no notable dental hx.    Pulmonary asthma , neg sleep apnea,    breath sounds clear to auscultation- rhonchi (-) wheezing      Cardiovascular Exercise Tolerance: Good (-) hypertension(-) CAD, (-) Past MI, (-) Cardiac Stents and (-) CABG  Rhythm:Regular Rate:Normal - Systolic murmurs and - Diastolic murmurs    Neuro/Psych neg Seizures negative neurological ROS  negative psych ROS   GI/Hepatic negative GI ROS, Neg liver ROS,   Endo/Other  diabetes, Oral Hypoglycemic Agents  Renal/GU negative Renal ROS     Musculoskeletal negative musculoskeletal ROS (+)   Abdominal (+) + obese,   Peds  Hematology negative hematology ROS (+)   Anesthesia Other Findings Past Medical History: No date: Asthma No date: Diabetes mellitus without complication (HCC)   Reproductive/Obstetrics                             Anesthesia Physical Anesthesia Plan  ASA: 2  Anesthesia Plan: General   Post-op Pain Management:    Induction: Intravenous  PONV Risk Score and Plan: 2 and Propofol infusion  Airway Management Planned: Natural Airway  Additional Equipment:   Intra-op Plan:   Post-operative Plan:   Informed Consent: I have reviewed the patients History and Physical, chart, labs and discussed the procedure including the risks, benefits and alternatives for the proposed anesthesia with the patient or authorized representative who has indicated his/her understanding and acceptance.     Dental advisory given  Plan Discussed with: CRNA and  Anesthesiologist  Anesthesia Plan Comments:         Anesthesia Quick Evaluation

## 2020-08-31 ENCOUNTER — Encounter: Payer: Self-pay | Admitting: Gastroenterology

## 2020-09-01 LAB — SURGICAL PATHOLOGY

## 2020-10-07 ENCOUNTER — Other Ambulatory Visit: Payer: Self-pay

## 2020-10-07 ENCOUNTER — Emergency Department: Payer: BC Managed Care – PPO

## 2020-10-07 ENCOUNTER — Emergency Department
Admission: EM | Admit: 2020-10-07 | Discharge: 2020-10-08 | Disposition: A | Discharge status: Home / Self Care | Payer: BC Managed Care – PPO | Attending: Emergency Medicine | Admitting: Emergency Medicine

## 2020-10-07 DIAGNOSIS — Z91040 Latex allergy status: Secondary | ICD-10-CM | POA: Diagnosis not present

## 2020-10-07 DIAGNOSIS — N390 Urinary tract infection, site not specified: Secondary | ICD-10-CM | POA: Insufficient documentation

## 2020-10-07 DIAGNOSIS — R109 Unspecified abdominal pain: Secondary | ICD-10-CM | POA: Insufficient documentation

## 2020-10-07 DIAGNOSIS — Z7984 Long term (current) use of oral hypoglycemic drugs: Secondary | ICD-10-CM | POA: Insufficient documentation

## 2020-10-07 DIAGNOSIS — E119 Type 2 diabetes mellitus without complications: Secondary | ICD-10-CM | POA: Insufficient documentation

## 2020-10-07 DIAGNOSIS — J45909 Unspecified asthma, uncomplicated: Secondary | ICD-10-CM | POA: Diagnosis not present

## 2020-10-07 DIAGNOSIS — R300 Dysuria: Secondary | ICD-10-CM | POA: Diagnosis present

## 2020-10-07 LAB — BASIC METABOLIC PANEL
Anion gap: 9 (ref 5–15)
BUN: 12 mg/dL (ref 6–20)
CO2: 24 mmol/L (ref 22–32)
Calcium: 9.5 mg/dL (ref 8.9–10.3)
Chloride: 107 mmol/L (ref 98–111)
Creatinine, Ser: 0.73 mg/dL (ref 0.44–1.00)
GFR, Estimated: >60 mL/min (ref >60)
Glucose, Bld: 120 mg/dL — ABNORMAL HIGH (ref 70–99)
Potassium: 4.1 mmol/L (ref 3.5–5.1)
Sodium: 140 mmol/L (ref 135–145)

## 2020-10-07 LAB — CBC
HCT: 39.3 % (ref 36.0–46.0)
Hemoglobin: 13 g/dL (ref 12.0–15.0)
MCH: 29.9 pg (ref 26.0–34.0)
MCHC: 33.1 g/dL (ref 30.0–36.0)
MCV: 90.3 fL (ref 80.0–100.0)
Platelets: 290 10*3/uL (ref 150–400)
RBC: 4.35 MIL/uL (ref 3.87–5.11)
RDW: 12.7 % (ref 11.5–15.5)
WBC: 10.5 10*3/uL (ref 4.0–10.5)
nRBC: 0 % (ref 0.0–0.2)

## 2020-10-07 NOTE — ED Triage Notes (Signed)
Pt to ED POV for left sided flank pain that started a week ago with burning with urination.  Pt appears uncomfortable.  Denies hematuria, N/V  Pt also c/o shob that started today, speaking in complete sentences

## 2020-10-08 ENCOUNTER — Emergency Department: Payer: BC Managed Care – PPO

## 2020-10-08 LAB — POC URINE PREG, ED: Preg Test, Ur: NEGATIVE

## 2020-10-08 LAB — URINALYSIS, ROUTINE W REFLEX MICROSCOPIC
Bilirubin Urine: NEGATIVE
Glucose, UA: NEGATIVE mg/dL
Hgb urine dipstick: NEGATIVE
Ketones, ur: 5 mg/dL — AB
Nitrite: POSITIVE — AB
Protein, ur: NEGATIVE mg/dL
Specific Gravity, Urine: 1.025 (ref 1.005–1.030)
pH: 5 (ref 5.0–8.0)

## 2020-10-08 MED ORDER — CEPHALEXIN 500 MG PO CAPS
500.0000 mg | ORAL_CAPSULE | Freq: Once | ORAL | Status: AC
  Administered 2020-10-08: 500 mg via ORAL
  Filled 2020-10-08: qty 1

## 2020-10-08 MED ORDER — CEPHALEXIN 500 MG PO CAPS: 500.0000 mg | ORAL_CAPSULE | Freq: Four times a day (QID) | ORAL | 0 refills | Status: AC

## 2020-10-08 NOTE — Discharge Instructions (Addendum)

## 2020-10-08 NOTE — ED Provider Notes (Signed)
[facility] Emergency Department Provider Note  ____________________________________________   Event Date/Time   First MD Initiated Contact with Patient 10/07/20 2356     (approximate)  I have reviewed the triage vital signs and the nursing notes.   HISTORY  Chief Complaint Flank Pain    HPI Tonya Bender is a 60 y.o. female with medical history as listed below who presents for evaluation of dysuria and some left-sided flank pain.  She reports that she has had dysuria with foul-smelling urine for about a week.  She has been taking Azo and try to drink plenty of fluids but has not seemed to help.  Starting within the last 24 hours, she has had a little bit of a cough and when she coughs that causes severe sharp pain in her left flank.  She was concerned that she may have pneumonia because she has had in the past.  She describes the symptoms as relatively gradual in onset over the last week but the pain in her left flank was more severe tonight.  Nothing in particular makes the symptoms better and coughing makes it worse.  She denies fever, sore throat, chest pain, nausea, vomiting, and abdominal pain.  She felt little bit short of breath earlier but no longer.  She does not have a history of kidney stones.     Past Medical History:  Diagnosis Date   Asthma    Diabetes mellitus without complication [facility])     Patient Active Problem List   Diagnosis Date Noted   ALLERGIC RHINITIS 04/16/2007   ALLERGIC ASTHMA 04/16/2007    Past Surgical History:  Procedure Laterality Date   ABDOMINAL HYSTERECTOMY     BREAST EXCISIONAL BIOPSY Bilateral 2010   NEG   CHOLECYSTECTOMY     COLONOSCOPY WITH PROPOFOL N/A 08/28/2020   Procedure: COLONOSCOPY WITH PROPOFOL;  Surgeon: Lesly Rubenstein, MD;  Location: [facility] ENDOSCOPY;  Service: Endoscopy;  Laterality: N/A;    Prior to Admission medications   Medication Sig Start Date End Date Taking? Authorizing Provider   cephALEXin (KEFLEX) 500 MG capsule Take 1 capsule (500 mg total) by mouth 4 (four) times daily for 12 days. 10/08/20 10/20/20 Yes Hinda Kehr, MD  acetaminophen (TYLENOL) 500 MG tablet 1-2 tablets by mouth  as needed for pain    [provider]  albuterol (VENTOLIN HFA) 108 (90 Base) MCG/ACT inhaler Inhale 1-2 puffs into the lungs every 6 (six) hours as needed for wheezing or shortness of breath. 11/04/19 12/04/19  Laurene Footman B, PA-C  doxycycline (VIBRAMYCIN) 100 MG capsule Take 1 capsule (100 mg total) by mouth 2 (two) times daily. 11/12/17   Marylene Land, NP  Dulaglutide (TRULICITY) [id number] [id number] SOPN Inject into the skin. 04/17/19   [provider]  fluticasone (FLONASE) 50 MCG/ACT nasal spray Place into both nostrils. 09/13/19   [provider]  glimepiride (AMARYL) 4 MG tablet Take by mouth. 05/02/14 05/02/15  [provider]  glimepiride (AMARYL) 4 MG tablet Take by mouth. 07/28/17   [provider]  glucose blood test strip Use once daily. As directed 11/11/15   [provider]  metFORMIN (GLUCOPHAGE-XR) 500 MG 24 hr tablet Take by mouth. 05/02/14 05/02/15  [provider]  metFORMIN (GLUCOPHAGE-XR) 500 MG 24 hr tablet Take by mouth. 07/17/15   [provider]  pantoprazole (PROTONIX) 40 MG tablet Take by mouth. 01/30/14 11/12/17  [provider]  phentermine (ADIPEX-P) 37.5 MG tablet Take by mouth.  [provider]    Allergies Latex and Penicillins  Family History  Problem Relation Age of Onset   Breast cancer Paternal Grandmother     Social History Social History   Tobacco Use   Smoking status: Never   Smokeless tobacco: Never  Substance Use Topics   Alcohol use: Yes    Alcohol/week: 0.0 standard drinks    Comment: occasionally    Drug use: Never    Review of Systems Constitutional: No fever/chills Eyes: No visual changes. ENT: No sore throat. Cardiovascular: Denies chest  pain. Respiratory: Mild shortness of breath and cough earlier, now improved. Gastrointestinal: No abdominal pain.  No nausea, no vomiting.  No diarrhea.  No constipation. Genitourinary: Positive for dysuria and foul-smelling urine. Musculoskeletal: Positive for left flank pain. Integumentary: Negative for rash. Neurological: Negative for headaches, focal weakness or numbness.   ____________________________________________   PHYSICAL EXAM:  VITAL SIGNS: ED Triage Vitals  Enc Vitals Group     BP 10/07/20 1632 140/79     Pulse Rate 10/07/20 1632 (!) 102     Resp 10/07/20 1632 20     Temp 10/07/20 1632 98.8 F (37.1 C)     Temp Source 10/07/20 1632 Oral     SpO2 10/07/20 1632 97 %     Weight 10/07/20 1633 123.4 kg (272 lb)     Height 10/07/20 1633 1.702 m ('5\' 7"'$ )     Head Circumference --      Peak Flow --      Pain Score 10/07/20 1633 10     Pain Loc --      Pain Edu? --      Excl. in [place]? --     Constitutional: Alert and oriented.  Eyes: Conjunctivae are normal.  Head: Atraumatic. Nose: No congestion/rhinnorhea. Mouth/Throat: Patient is wearing a mask. Neck: No stridor.  No meningeal signs.   Cardiovascular: Normal rate, regular rhythm. Good peripheral circulation. Respiratory: Normal respiratory effort.  No retractions. Gastrointestinal: Soft and nontender. No distention.  Musculoskeletal: Left flank tenderness to percussion. Neurologic:  Normal speech and language. No gross focal neurologic deficits are appreciated.  Skin:  Skin is warm, dry and intact. Psychiatric: Mood and affect are normal. Speech and behavior are normal.  ____________________________________________   LABS (all labs ordered are listed, but only abnormal results are displayed)  Labs Reviewed  BASIC METABOLIC PANEL - Abnormal; Notable for the following components:      Result Value   Glucose, Bld 120 (*)    All other components within normal limits  URINALYSIS, ROUTINE W REFLEX MICROSCOPIC -  Abnormal; Notable for the following components:   Color, Urine YELLOW (*)    APPearance HAZY (*)    Ketones, ur 5 (*)    Nitrite POSITIVE (*)    Leukocytes,Ua TRACE (*)    Bacteria, UA FEW (*)    All other components within normal limits  CBC  POC URINE PREG, ED   ____________________________________________  EKG  ED ECG REPORT I, Hinda Kehr, the attending physician, personally viewed and interpreted this ECG.  Date: 10/07/2020 EKG Time: 16:39 Rate: 98 Rhythm: normal sinus rhythm QRS Axis: normal Intervals: normal ST/T Wave abnormalities: normal Narrative Interpretation: no evidence of acute ischemia  ____________________________________________  RADIOLOGY I, Hinda Kehr, personally viewed and evaluated these images (plain radiographs) as part of my medical decision making, as well as reviewing the written report by the radiologist.  ED MD interpretation: No acute abnormalities on chest x-ray  Official radiology report(s): DG  Chest 2 View  Result Date: 10/07/2020 CLINICAL DATA:  Chest pain and short of breath EXAM: CHEST - 2 VIEW COMPARISON:  11/04/2019 FINDINGS: The heart size and mediastinal contours are within normal limits. Both lungs are clear. The visualized skeletal structures are unremarkable. IMPRESSION: No active cardiopulmonary disease. Electronically Signed   By: Franchot Gallo M.D.   On: 10/07/2020 17:15   CT Renal Stone Study  Result Date: 10/08/2020 CLINICAL DATA:  Left flank pain. EXAM: CT ABDOMEN AND PELVIS WITHOUT CONTRAST TECHNIQUE: Multidetector CT imaging of the abdomen and pelvis was performed following the standard protocol without IV contrast. COMPARISON:  None. FINDINGS: Lower chest: No acute abnormality. Hepatobiliary: There is diffuse fatty infiltration of the liver parenchyma. No focal liver abnormality is seen. Status post cholecystectomy. No biliary dilatation. Pancreas: Unremarkable. No pancreatic ductal dilatation or surrounding inflammatory  changes. Spleen: Normal in size without focal abnormality. Adrenals/Urinary Tract: Adrenal glands are unremarkable. Kidneys are normal, without renal calculi, focal lesion, or hydronephrosis. Bladder is unremarkable. Stomach/Bowel: Stomach is within normal limits. Appendix appears normal. No evidence of bowel wall thickening, distention, or inflammatory changes. Vascular/Lymphatic: No significant vascular findings are present. No enlarged abdominal or pelvic lymph nodes. Reproductive: Status post hysterectomy. No adnexal masses. Other: No abdominal wall hernia or abnormality. No abdominopelvic ascites. Musculoskeletal: No acute or significant osseous findings. IMPRESSION: 1. Fatty liver. 2. Evidence of prior cholecystectomy. 3. No acute or active process within the abdomen or pelvis. Electronically Signed   By: Virgina Norfolk M.D.   On: 10/08/2020 01:23    ____________________________________________   PROCEDURES   Procedure(s) performed (including Critical Care):  Procedures   ____________________________________________   INITIAL IMPRESSION / MDM / [place] / ED COURSE  As part of my medical decision making, I reviewed the following data within the [place] notes reviewed and incorporated, Labs reviewed , Old chart reviewed, and Notes from prior ED visits   Differential diagnosis includes, but is not limited to, UTI, pyelonephritis, renal/ureteral colic, musculoskeletal strain, left lower lobe pneumonia.  Patient's primary symptoms are dysuria and foul-smelling urine.  He has been taking Azo which will likely make interpretation of the urinalysis slightly more complicated but she is providing urine specimen now.  She has been stable for more than 8 hours in the emergency department.  She has some mild tachycardia but that could be exertional when she walks around and her vitals are otherwise unremarkable and she is afebrile.  Her symptoms are  relatively mild although she does have significant left flank tenderness to percussion.  No abdominal tenderness.  I personally reviewed the patient's imaging and agree with the radiologist's interpretation that there is no evidence of any pneumonia on chest x-ray.  Basic metabolic panel and CBC are both within normal limits.  Given that she is having dysuria and foul-smelling urine and now with left flank pain, I will obtain a CT renal stone protocol to look for a stone which could be important if she has obstructive pathology with a proximal infection.  However overall she is well-appearing other than some inconsistent tachycardia and left flank pain.  Patient understands and agrees with the plan for UA, CT, and reassessment.     Clinical Course as of 10/08/20 0316  Thu Oct 08, 2020  0227 CT Renal Stone Study No acute abnormality identified on CT scan including no evidence of pyelonephritis nor stone [CF]  0312 Urinalysis, Routine w reflex microscopic Urine, Clean Catch(!) Urinalysis nitrate positive consistent  with infection.  Treating with Keflex.  I updated the patient and she is comfortable with the plan for discharge and outpatient follow-up.  I gave my usual and customary return precautions. [CF]    Clinical Course User Index [CF] Hinda Kehr, MD     ____________________________________________  FINAL CLINICAL IMPRESSION(S) / ED DIAGNOSES  Final diagnoses:  Urinary tract infection without hematuria, site unspecified  Left flank pain     MEDICATIONS GIVEN DURING THIS VISIT:  Medications  cephALEXin (KEFLEX) capsule 500 mg (has no administration in time range)     ED Discharge Orders          Ordered    cephALEXin (KEFLEX) 500 MG capsule  4 times daily        10/08/20 0315             Note:  This document was prepared using Dragon voice recognition software and may include unintentional dictation errors.   Hinda Kehr, MD 10/08/20 [phone]

## 2021-03-05 ENCOUNTER — Ambulatory Visit
Admission: EM | Admit: 2021-03-05 | Discharge: 2021-03-05 | Disposition: A | Discharge status: Home / Self Care | Payer: BC Managed Care – PPO | Attending: Emergency Medicine | Admitting: Emergency Medicine

## 2021-03-05 ENCOUNTER — Encounter: Payer: Self-pay | Admitting: Emergency Medicine

## 2021-03-05 ENCOUNTER — Other Ambulatory Visit: Payer: Self-pay

## 2021-03-05 DIAGNOSIS — H109 Unspecified conjunctivitis: Secondary | ICD-10-CM

## 2021-03-05 MED ORDER — POLYMYXIN B-TRIMETHOPRIM 10000-0.1 UNIT/ML-% OP SOLN: 1.0000 [drp] | OPHTHALMIC | 0 refills | Status: AC

## 2021-03-05 NOTE — ED Triage Notes (Signed)
Patient states that she woke up this morning with redness and drainage from her right eye.  Patient denies fevers.

## 2021-03-05 NOTE — ED Provider Notes (Signed)
[facility]    CSN: [id number] Arrival date & time: 03/05/21  1112      History   Chief Complaint Chief Complaint  Patient presents with   Eye Drainage    right    HPI Tonya Bender is a 60 y.o. female.   Patient presents with drainage, redness, itching and soreness of the right eye beginning this morning.  No involvement of the left eye.  Has not attempted treatment.  Denies blurred vision, lightheadedness sensitivity or floaters.  Attempted to get appointment with ophthalmologist but was unable to be seen until tomorrow.  Does not wear contacts.  History of diabetes, asthma.  Past Medical History:  Diagnosis Date   Asthma    Diabetes mellitus without complication [facility])     Patient Active Problem List   Diagnosis Date Noted   ALLERGIC RHINITIS 04/16/2007   ALLERGIC ASTHMA 04/16/2007    Past Surgical History:  Procedure Laterality Date   ABDOMINAL HYSTERECTOMY     BREAST EXCISIONAL BIOPSY Bilateral 2010   NEG   CHOLECYSTECTOMY     COLONOSCOPY WITH PROPOFOL N/A 08/28/2020   Procedure: COLONOSCOPY WITH PROPOFOL;  Surgeon: Lesly Rubenstein, MD;  Location: [facility] ENDOSCOPY;  Service: Endoscopy;  Laterality: N/A;    OB History   No obstetric history on file.      Home Medications    Prior to Admission medications   Medication Sig Start Date End Date Taking? Authorizing Provider  Dulaglutide (TRULICITY) 9.47 ML/4.6TK SOPN Inject into the skin. 04/17/19  Yes [provider]  fluticasone (FLONASE) 50 MCG/ACT nasal spray Place into both nostrils. 09/13/19  Yes [provider]  glimepiride (AMARYL) 4 MG tablet Take by mouth. 07/28/17  Yes [provider]  metFORMIN (GLUCOPHAGE-XR) 500 MG 24 hr tablet Take by mouth. 07/17/15  Yes [provider]  pantoprazole (PROTONIX) 40 MG tablet Take by mouth. 01/30/14 03/05/21 Yes [provider]  acetaminophen (TYLENOL) 500 MG tablet 1-2 tablets by mouth  as needed for pain     [provider]  albuterol (VENTOLIN HFA) 108 (90 Base) MCG/ACT inhaler Inhale 1-2 puffs into the lungs every 6 (six) hours as needed for wheezing or shortness of breath. 11/04/19 12/04/19  Laurene Footman B, PA-C  doxycycline (VIBRAMYCIN) 100 MG capsule Take 1 capsule (100 mg total) by mouth 2 (two) times daily. 11/12/17   Marylene Land, NP  glimepiride (AMARYL) 4 MG tablet Take by mouth. 05/02/14 05/02/15  [provider]  glucose blood test strip Use once daily. As directed 11/11/15   [provider]  metFORMIN (GLUCOPHAGE-XR) 500 MG 24 hr tablet Take by mouth. 05/02/14 05/02/15  [provider]  phentermine (ADIPEX-P) 37.5 MG tablet Take by mouth.    [provider]    Family History Family History  Problem Relation Age of Onset   Breast cancer Paternal Grandmother     Social History Social History   Tobacco Use   Smoking status: Never   Smokeless tobacco: Never  Vaping Use   Vaping Use: Never used  Substance Use Topics   Alcohol use: Yes    Alcohol/week: 0.0 standard drinks    Comment: occasionally    Drug use: Never     Allergies   Latex and Penicillins   Review of Systems Review of Systems  Constitutional: Negative.   Eyes:  Positive for pain, discharge, redness and itching. Negative for photophobia and visual disturbance.  Respiratory: Negative.    Cardiovascular: Negative.   Skin:  Negative.   Neurological: Negative.     Physical Exam Triage Vital Signs ED Triage Vitals  Enc Vitals Group     BP 03/05/21 1345 109/82     Pulse Rate 03/05/21 1345 (!) 102     Resp 03/05/21 1345 14     Temp 03/05/21 1345 98.8 F (37.1 C)     Temp Source 03/05/21 1345 Oral     SpO2 03/05/21 1345 97 %     Weight 03/05/21 1342 270 lb (122.5 kg)     Height 03/05/21 1342 5' 7.5" (1.715 m)     Head Circumference --      Peak Flow --      Pain Score 03/05/21 1342 2     Pain Loc --      Pain Edu? --      Excl. in [place]? --    No data  found.  Updated Vital Signs BP 109/82 (BP Location: Right Arm)    Pulse (!) 102    Temp 98.8 F (37.1 C) (Oral)    Resp 14    Ht 5' 7.5" (1.715 m)    Wt 270 lb (122.5 kg)    SpO2 97%    BMI 41.66 kg/m   Visual Acuity Right Eye Distance: 20/25 corrected Left Eye Distance: 20/30 corrected Bilateral Distance: 20/25 corrected  Right Eye Near:   Left Eye Near:    Bilateral Near:     Physical Exam Constitutional:      Appearance: Normal appearance.  HENT:     Head: Normocephalic.  Eyes:     Comments: Erythema present in the right conjunctiva, no drainage or swelling noted, vision intact, extraocular movements intact, no erythema, swelling or drainage noted in left eye  Pulmonary:     Effort: Pulmonary effort is normal.  Skin:    General: Skin is warm and dry.  Neurological:     Mental Status: She is alert and oriented to person, place, and time. Mental status is at baseline.  Psychiatric:        Mood and Affect: Mood normal.        Behavior: Behavior normal.     UC Treatments / Results  Labs (all labs ordered are listed, but only abnormal results are displayed) Labs Reviewed - No data to display  EKG   Radiology No results found.  Procedures Procedures (including critical care time)  Medications Ordered in UC Medications - No data to display  Initial Impression / Assessment and Plan / UC Course  I have reviewed the triage vital signs and the nursing notes.  Pertinent labs & imaging results that were available during my care of the patient were reviewed by me and considered in my medical decision making (see chart for details).  Bacterial conjunctivitis of right eye  Polytrim prescribed for 7-day course, patient may use medication left eye to prevent infection, advised discontinuation of make-up until symptoms have resolved, over-the-counter medications for pain management, recommended oral antihistamines to help with itching, cool compresses to remove drainage,  advised patient to not rub to prevent irritation, advised patient to not touch eyes to prevent spreading of bacteria, ophthalmology follow-up if symptoms persist, patient already has provider  Final Clinical Impressions(s) / UC Diagnoses   Final diagnoses:  None   Discharge Instructions   None    ED Prescriptions   None    PDMP not reviewed this encounter.   Hans Eden, NP 03/05/21 1400

## 2021-03-05 NOTE — Discharge Instructions (Addendum)
Today you being treated for bacterial conjunctivitis.   Place one drop of polytrim into the effected eye every 4 hours while awake for 7 days. If the other eye starts to have symptoms you may use medication in it as well. Do not allow tip of dropper to touch eye. May use cool compress for comfort and to remove discharge if present. Pat the eye, do not wipe.  Do not rub eyes, this may cause more irritation.  May use benadryl as needed to help if itching present.  Please avoid use of eye makeup until symptoms clear.  If symptoms persist after use of medication, please follow up at Urgent Care or with ophthalmologist (eye doctor)

## 2022-08-06 ENCOUNTER — Ambulatory Visit
Admission: EM | Admit: 2022-08-06 | Discharge: 2022-08-06 | Disposition: A | Discharge status: Home / Self Care | Payer: BC Managed Care – PPO | Attending: Physician Assistant | Admitting: Physician Assistant

## 2022-08-06 DIAGNOSIS — Z1152 Encounter for screening for COVID-19: Secondary | ICD-10-CM | POA: Diagnosis not present

## 2022-08-06 DIAGNOSIS — J029 Acute pharyngitis, unspecified: Secondary | ICD-10-CM | POA: Insufficient documentation

## 2022-08-06 DIAGNOSIS — J45909 Unspecified asthma, uncomplicated: Secondary | ICD-10-CM | POA: Insufficient documentation

## 2022-08-06 DIAGNOSIS — J069 Acute upper respiratory infection, unspecified: Secondary | ICD-10-CM | POA: Diagnosis present

## 2022-08-06 DIAGNOSIS — B9789 Other viral agents as the cause of diseases classified elsewhere: Secondary | ICD-10-CM | POA: Insufficient documentation

## 2022-08-06 DIAGNOSIS — E119 Type 2 diabetes mellitus without complications: Secondary | ICD-10-CM | POA: Insufficient documentation

## 2022-08-06 LAB — GROUP A STREP BY PCR: Group A Strep by PCR: NOT DETECTED

## 2022-08-06 LAB — SARS CORONAVIRUS 2 BY RT PCR: SARS Coronavirus 2 by RT PCR: NEGATIVE

## 2022-08-06 MED ORDER — LIDOCAINE VISCOUS HCL 2 % MT SOLN: 15.0000 mL | OROMUCOSAL | 0 refills | Status: AC | PRN

## 2022-08-06 MED ORDER — KETOROLAC TROMETHAMINE 30 MG/ML IJ SOLN: 30.0000 mg | Freq: Once | INTRAMUSCULAR | Status: DC

## 2022-08-06 NOTE — Discharge Instructions (Addendum)
-  Will call if COVID is + -Strep is negative  URI/COLD SYMPTOMS: Your exam today is consistent with a viral illness. Antibiotics are not indicated at this time. Use medications as directed, including cough syrup, nasal saline, and decongestants. Your symptoms should improve over the next few days and resolve within 7-10 days. Increase rest and fluids. F/u if symptoms worsen or predominate such as sore throat, ear pain, productive cough, shortness of breath, or if you develop high fevers or worsening fatigue over the next several days.

## 2022-08-06 NOTE — ED Triage Notes (Signed)
Pt c/o Possible strep throat x3days  Pt states that her throat began to hurt on Thursday night and it has gotten worse.

## 2022-08-06 NOTE — ED Provider Notes (Signed)
[facility]    CSN: [id number] Arrival date & time: 08/06/22  0825      History   Chief Complaint Chief Complaint  Patient presents with   Sore Throat    HPI Tonya Bender is a 62 y.o. female presenting for sore throat, congestion and cough x 3 days.  Denies fever, fatigue, aches, breathing difficulty, vomiting or diarrhea.  No known exposure to strep but she thinks she could potentially have strep throat.  She has not been taking any OTC meds for symptoms.  No other complaints.  Medical history significant for asthma and diabetes.  HPI  Past Medical History:  Diagnosis Date   Asthma    Diabetes mellitus without complication [facility])     Patient Active Problem List   Diagnosis Date Noted   ALLERGIC RHINITIS 04/16/2007   ALLERGIC ASTHMA 04/16/2007    Past Surgical History:  Procedure Laterality Date   ABDOMINAL HYSTERECTOMY     BREAST EXCISIONAL BIOPSY Bilateral 2010   NEG   CHOLECYSTECTOMY     COLONOSCOPY WITH PROPOFOL N/A 08/28/2020   Procedure: COLONOSCOPY WITH PROPOFOL;  Surgeon: Regis Bill, MD;  Location: [facility] ENDOSCOPY;  Service: Endoscopy;  Laterality: N/A;    OB History   No obstetric history on file.      Home Medications    Prior to Admission medications   Medication Sig Start Date End Date Taking? Authorizing Provider  acetaminophen (TYLENOL) 500 MG tablet 1-2 tablets by mouth  as needed for pain   Yes [provider]  Dulaglutide (TRULICITY) 0.75 MG/0.5ML SOPN Inject into the skin. 04/17/19  Yes [provider]  fluticasone (FLONASE) 50 MCG/ACT nasal spray Place into both nostrils. 09/13/19  Yes [provider]  glimepiride (AMARYL) 4 MG tablet Take by mouth. 07/28/17  Yes [provider]  glucose blood test strip Use once daily. As directed 11/11/15  Yes [provider]  lidocaine (XYLOCAINE) 2 % solution Use as directed 15 mLs in the mouth or throat every 3 (three) hours as needed for  mouth pain (swish and spit). 08/06/22  Yes Shirlee Latch, PA-C  metFORMIN (GLUCOPHAGE-XR) 500 MG 24 hr tablet Take by mouth. 07/17/15  Yes [provider]  trimethoprim-polymyxin b (POLYTRIM) ophthalmic solution Place 1 drop into the right eye every 4 (four) hours. 03/05/21  Yes White, Elita Boone, NP  albuterol (VENTOLIN HFA) 108 (90 Base) MCG/ACT inhaler Inhale 1-2 puffs into the lungs every 6 (six) hours as needed for wheezing or shortness of breath. 11/04/19 12/04/19  Eusebio Friendly B, PA-C  glimepiride (AMARYL) 4 MG tablet Take by mouth. 05/02/14 05/02/15  [provider]  metFORMIN (GLUCOPHAGE-XR) 500 MG 24 hr tablet Take by mouth. 05/02/14 05/02/15  [provider]  pantoprazole (PROTONIX) 40 MG tablet Take by mouth. 01/30/14 03/05/21  [provider]    Family History Family History  Problem Relation Age of Onset   Breast cancer Paternal Grandmother     Social History Social History   Tobacco Use   Smoking status: Never   Smokeless tobacco: Never  Vaping Use   Vaping Use: Never used  Substance Use Topics   Alcohol use: Yes    Alcohol/week: 0.0 standard drinks of alcohol    Comment: occasionally    Drug use: Never     Allergies   Latex and Penicillins   Review of Systems Review of Systems  Constitutional:  Negative for chills, diaphoresis, fatigue and fever.  HENT:  Positive for  congestion and sore throat. Negative for ear pain, rhinorrhea, sinus pressure and sinus pain.   Respiratory:  Positive for cough. Negative for shortness of breath.   Gastrointestinal:  Negative for abdominal pain, nausea and vomiting.  Musculoskeletal:  Negative for arthralgias and myalgias.  Skin:  Negative for rash.  Neurological:  Negative for weakness and headaches.  Hematological:  Negative for adenopathy.     Physical Exam Triage Vital Signs ED Triage Vitals  Enc Vitals Group     BP      Pulse      Resp      Temp      Temp src      SpO2       Weight      Height      Head Circumference      Peak Flow      Pain Score      Pain Loc      Pain Edu?      Excl. in GC?    No data found.  Updated Vital Signs BP (!) 140/87   Pulse (!) 102   Temp 99.2 F (37.3 C) (Oral)   Ht 5\' 7"  (1.702 m)   Wt 261 lb (118.4 kg)   SpO2 96%   BMI 40.88 kg/m      Physical Exam Vitals and nursing note reviewed.  Constitutional:      General: She is not in acute distress.    Appearance: Normal appearance. She is not ill-appearing or toxic-appearing.  HENT:     Head: Normocephalic and atraumatic.     Nose: Congestion present.     Mouth/Throat:     Mouth: Mucous membranes are moist.     Pharynx: Oropharynx is clear. Posterior oropharyngeal erythema present.  Eyes:     General: No scleral icterus.       Right eye: No discharge.        Left eye: No discharge.     Conjunctiva/sclera: Conjunctivae normal.  Cardiovascular:     Rate and Rhythm: Normal rate and regular rhythm.     Heart sounds: Normal heart sounds.  Pulmonary:     Effort: Pulmonary effort is normal. No respiratory distress.     Breath sounds: Normal breath sounds.  Musculoskeletal:     Cervical back: Neck supple.  Skin:    General: Skin is dry.  Neurological:     General: No focal deficit present.     Mental Status: She is alert. Mental status is at baseline.     Motor: No weakness.     Gait: Gait normal.  Psychiatric:        Mood and Affect: Mood normal.        Behavior: Behavior normal.        Thought Content: Thought content normal.      UC Treatments / Results  Labs (all labs ordered are listed, but only abnormal results are displayed) Labs Reviewed  GROUP A STREP BY PCR  SARS CORONAVIRUS 2 BY RT PCR    EKG   Radiology No results found.  Procedures Procedures (including critical care time)  Medications Ordered in UC Medications - No data to display  Initial Impression / Assessment and Plan / UC Course  I have reviewed the triage vital signs  and the nursing notes.  Pertinent labs & imaging results that were available during my care of the patient were reviewed by me and considered in my medical decision making (see chart for details).  62 year old female presents for cough, congestion and sore throat x 3 days.  No fever.  Concern for strep.  She is afebrile and overall well-appearing.  On exam is nasal congestion and mild posterior pharyngeal erythema.  Chest clear to auscultation.  Strep is negative.  PCR COVID test obtained. Negative.  Viral illness.  Supportive care encouraged with increased rest and fluids, over-the-counter DayQuil/NyQuil, ibuprofen.  Sent viscous lidocaine to pharmacy.  Reviewed return precautions.   Final Clinical Impressions(s) / UC Diagnoses   Final diagnoses:  Viral upper respiratory tract infection  Sore throat     Discharge Instructions      -Will call if COVID is + -Strep is negative  URI/COLD SYMPTOMS: Your exam today is consistent with a viral illness. Antibiotics are not indicated at this time. Use medications as directed, including cough syrup, nasal saline, and decongestants. Your symptoms should improve over the next few days and resolve within 7-10 days. Increase rest and fluids. F/u if symptoms worsen or predominate such as sore throat, ear pain, productive cough, shortness of breath, or if you develop high fevers or worsening fatigue over the next several days.       ED Prescriptions     Medication Sig Dispense Auth. Provider   lidocaine (XYLOCAINE) 2 % solution Use as directed 15 mLs in the mouth or throat every 3 (three) hours as needed for mouth pain (swish and spit). 100 mL Shirlee Latch, PA-C      PDMP not reviewed this encounter.   Shirlee Latch, PA-C 08/06/22 1058

## 2022-11-07 ENCOUNTER — Ambulatory Visit
Admission: EM | Admit: 2022-11-07 | Discharge: 2022-11-07 | Disposition: A | Discharge status: Home / Self Care | Payer: BC Managed Care – PPO | Attending: Internal Medicine | Admitting: Internal Medicine

## 2022-11-07 DIAGNOSIS — L0231 Cutaneous abscess of buttock: Secondary | ICD-10-CM | POA: Diagnosis not present

## 2022-11-07 DIAGNOSIS — L02219 Cutaneous abscess of trunk, unspecified: Secondary | ICD-10-CM

## 2022-11-07 MED ORDER — DOXYCYCLINE HYCLATE 100 MG PO CAPS
100.0000 mg | ORAL_CAPSULE | Freq: Two times a day (BID) | ORAL | 0 refills | Status: AC
Start: 2022-11-07 — End: ?

## 2022-11-07 NOTE — Discharge Instructions (Signed)
Start doxycycline twice daily for 10 days.  Continue warm compresses to the area as needed.  Please follow-up with your PCP in 2 days for recheck.  Please go to the ER if you develop any worsening symptoms.  I hope you feel better soon!

## 2022-11-07 NOTE — ED Triage Notes (Addendum)
Patient presents with abscess inside of left buttocks, 2 on the vaginal area x 2 weeks. she feels they are infected. Patient treated with Neosporin.

## 2022-11-07 NOTE — ED Provider Notes (Signed)
[facility]    CSN: [id number] Arrival date & time: 11/07/22  1442      History   Chief Complaint No chief complaint on file.   HPI Tonya Bender is a 62 y.o. female presents for evaluation of an abscess.  Patient reports 2 weeks of a worsening abscess on her buttock as well as on the suprapubic region.  States one of the buttock began draining about a week ago and she has been using warm compresses and Neosporin but feels it is not improving very much.  Denies fevers or chills.  No history of MRSA.  She has had an abscess in the past.  No other concerns at this time.  HPI  Past Medical History:  Diagnosis Date   Asthma    Diabetes mellitus without complication [facility])     Patient Active Problem List   Diagnosis Date Noted   ALLERGIC RHINITIS 04/16/2007   ALLERGIC ASTHMA 04/16/2007    Past Surgical History:  Procedure Laterality Date   ABDOMINAL HYSTERECTOMY     BREAST EXCISIONAL BIOPSY Bilateral 2010   NEG   CHOLECYSTECTOMY     COLONOSCOPY WITH PROPOFOL N/A 08/28/2020   Procedure: COLONOSCOPY WITH PROPOFOL;  Surgeon: Regis Bill, MD;  Location: [facility] ENDOSCOPY;  Service: Endoscopy;  Laterality: N/A;    OB History   No obstetric history on file.      Home Medications    Prior to Admission medications   Medication Sig Start Date End Date Taking? Authorizing Provider  doxycycline (VIBRAMYCIN) 100 MG capsule Take 1 capsule (100 mg total) by mouth 2 (two) times daily. 11/07/22  Yes Radford Pax, NP  acetaminophen (TYLENOL) 500 MG tablet 1-2 tablets by mouth  as needed for pain    [provider]  albuterol (VENTOLIN HFA) 108 (90 Base) MCG/ACT inhaler Inhale 1-2 puffs into the lungs every 6 (six) hours as needed for wheezing or shortness of breath. 11/04/19 12/04/19  Eusebio Friendly B, PA-C  Dulaglutide (TRULICITY) 0.75 MG/0.5ML SOPN Inject into the skin. 04/17/19   [provider]  fluticasone (FLONASE) 50 MCG/ACT nasal spray Place into  both nostrils. 09/13/19   [provider]  glimepiride (AMARYL) 4 MG tablet Take by mouth. 05/02/14 05/02/15  [provider]  glimepiride (AMARYL) 4 MG tablet Take by mouth. 07/28/17   [provider]  glucose blood test strip Use once daily. As directed 11/11/15   [provider]  lidocaine (XYLOCAINE) 2 % solution Use as directed 15 mLs in the mouth or throat every 3 (three) hours as needed for mouth pain (swish and spit). 08/06/22   Shirlee Latch, PA-C  metFORMIN (GLUCOPHAGE-XR) 500 MG 24 hr tablet Take by mouth. 05/02/14 05/02/15  [provider]  metFORMIN (GLUCOPHAGE-XR) 500 MG 24 hr tablet Take by mouth. 07/17/15   [provider]  pantoprazole (PROTONIX) 40 MG tablet Take by mouth. 01/30/14 03/05/21  [provider]  trimethoprim-polymyxin b (POLYTRIM) ophthalmic solution Place 1 drop into the right eye every 4 (four) hours. 03/05/21   Valinda Hoar, NP    Family History Family History  Problem Relation Age of Onset   Breast cancer Paternal Grandmother     Social History Social History   Tobacco Use   Smoking status: Never   Smokeless tobacco: Never  Vaping Use   Vaping status: Never Used  Substance Use Topics   Alcohol use: Yes    Comment: occasionally    Drug use: Never  Allergies   Latex and Penicillins   Review of Systems Review of Systems  Skin:  Positive for wound.     Physical Exam Triage Vital Signs ED Triage Vitals  Encounter Vitals Group     BP 11/07/22 1629 (!) 170/103     Systolic BP Percentile --      Diastolic BP Percentile --      Pulse Rate 11/07/22 1629 94     Resp 11/07/22 1629 18     Temp 11/07/22 1629 99.3 F (37.4 C)     Temp Source 11/07/22 1629 Oral     SpO2 11/07/22 1629 98 %     Weight 11/07/22 1624 265 lb (120.2 kg)     Height 11/07/22 1624 5' 7.5" (1.715 m)     Head Circumference --      Peak Flow --      Pain Score 11/07/22 1624 8     Pain Loc --      Pain  Education --      Exclude from Growth Chart --    No data found.  Updated Vital Signs BP (!) 170/103 (BP Location: Left Arm)   Pulse 94   Temp 99.3 F (37.4 C) (Oral)   Resp 18   Ht 5' 7.5" (1.715 m)   Wt 265 lb (120.2 kg)   SpO2 98%   BMI 40.89 kg/m   Visual Acuity Right Eye Distance:   Left Eye Distance:   Bilateral Distance:    Right Eye Near:   Left Eye Near:    Bilateral Near:     Physical Exam Vitals and nursing note reviewed.  Constitutional:      Appearance: Normal appearance.  HENT:     Head: Normocephalic and atraumatic.  Eyes:     Pupils: Pupils are equal, round, and reactive to light.  Cardiovascular:     Rate and Rhythm: Normal rate.  Pulmonary:     Effort: Pulmonary effort is normal.  Abdominal:       Comments: Chaperone present.  Tanya MA  There are 2 nonfluctuant mildly indurated 1 cm abscesses to the suprapubic region.  Mildly tender to palpation.  No active drainage or swelling.  Skin:    General: Skin is warm and dry.          Comments: There is a 2 x 3 cm actively draining mildly erythematous indurated abscess to the left mid inner buttock.  Mildly tender to palpation.  Neurological:     General: No focal deficit present.     Mental Status: She is alert and oriented to person, place, and time.  Psychiatric:        Mood and Affect: Mood normal.        Behavior: Behavior normal.      UC Treatments / Results  Labs (all labs ordered are listed, but only abnormal results are displayed) Labs Reviewed - No data to display  EKG   Radiology No results found.  Procedures Procedures (including critical care time)  Medications Ordered in UC Medications - No data to display  Initial Impression / Assessment and Plan / UC Course  I have reviewed the triage vital signs and the nursing notes.  Pertinent labs & imaging results that were available during my care of the patient were reviewed by me and considered in my medical decision  making (see chart for details).  Clinical Course as of 11/07/22 1648  Mon Nov 07, 2022  1648 BP recheck 150/90 [JM]  Clinical Course User Index [JM] Radford Pax, NP    Reviewed exam and symptoms with patient.  Suprapubic abscesses did not need drainage at this time.  Buttock abscess is actively draining.  Advised to continue warm compresses and will start doxycycline twice daily for 10 days.  BP elevated on intake, recheck did improve.  Patient denies headache, chest pain, shortness of breath.  Patient to follow-up with PCP in 2 days for recheck.  Strict ER precautions reviewed and patient verbalized understanding. Final Clinical Impressions(s) / UC Diagnoses   Final diagnoses:  Abscess of left buttock  Abscess of suprapubic region     Discharge Instructions      Start doxycycline twice daily for 10 days.  Continue warm compresses to the area as needed.  Please follow-up with your PCP in 2 days for recheck.  Please go to the ER if you develop any worsening symptoms.  I hope you feel better soon!    ED Prescriptions     Medication Sig Dispense Auth. Provider   doxycycline (VIBRAMYCIN) 100 MG capsule Take 1 capsule (100 mg total) by mouth 2 (two) times daily. 20 capsule Radford Pax, NP      PDMP not reviewed this encounter.   Radford Pax, NP 11/07/22 [phone]

## 2022-12-11 IMAGING — CT CT RENAL STONE PROTOCOL
2 of 4 series · 16 of 46 positions shown, 18 images · non-contrast
Comparison: None.

CLINICAL DATA: Left flank pain.

EXAM:
CT ABDOMEN AND PELVIS WITHOUT CONTRAST
TECHNIQUE: Multidetector CT imaging of the abdomen and pelvis was performed
following the standard protocol without IV contrast.

[Series 2: stone full standard · axial · 0.91mm/px · z∈[-983,-543]mm · 13 of 98 slices shown, 15 images]
[im 5/98  soft-tissue]
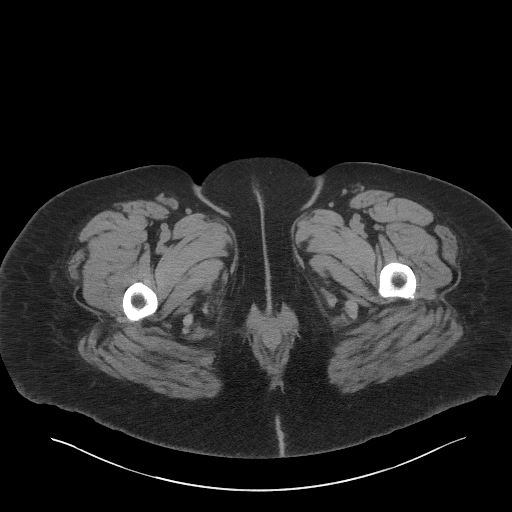
[im 5/98  bone]
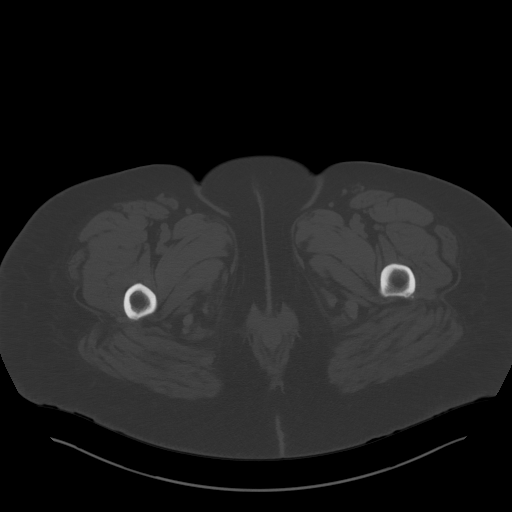
[im 13/98  soft-tissue]
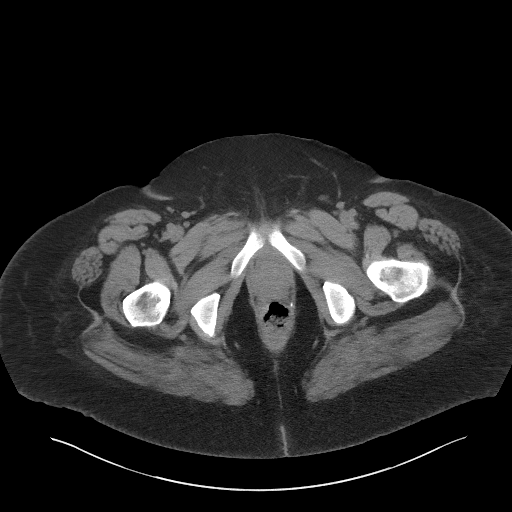
[im 22/98  soft-tissue]
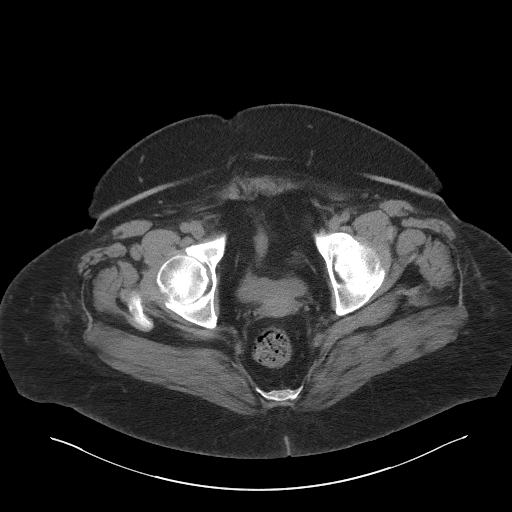
[im 26/98  soft-tissue]
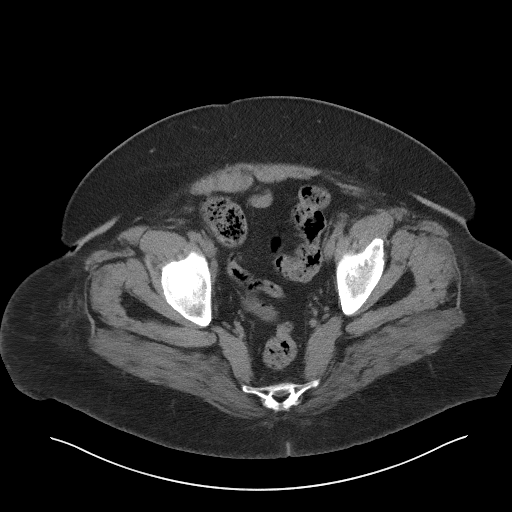
[im 34/98  soft-tissue]
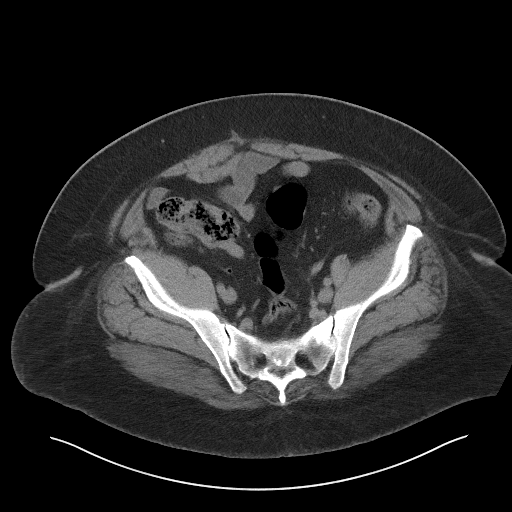
[im 43/98  soft-tissue]
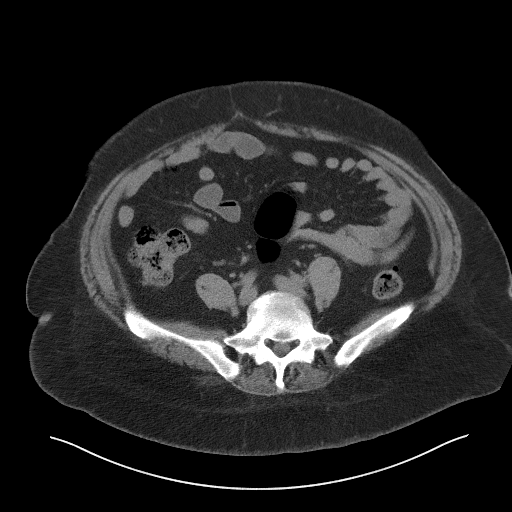
[im 51/98  soft-tissue]
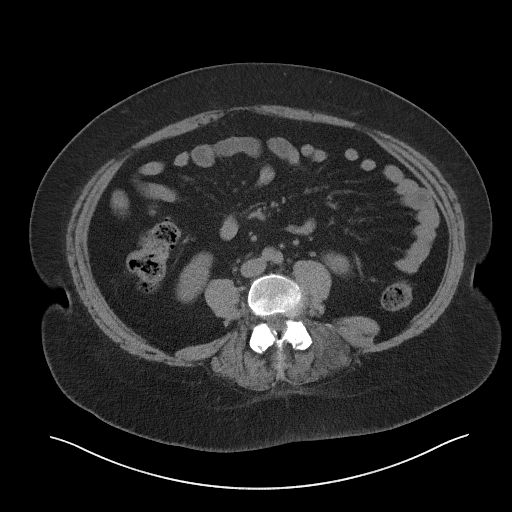
[im 55/98  soft-tissue]
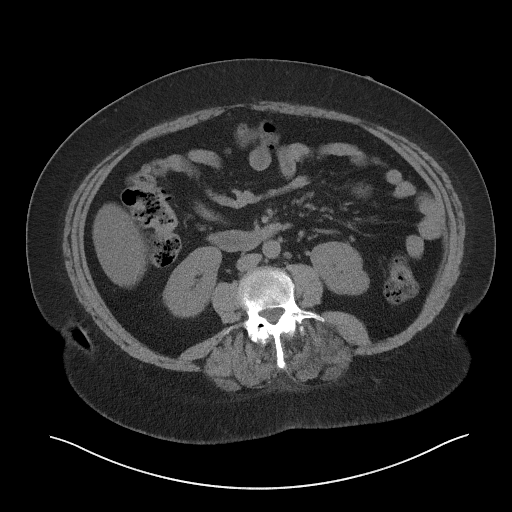
[im 64/98  soft-tissue]
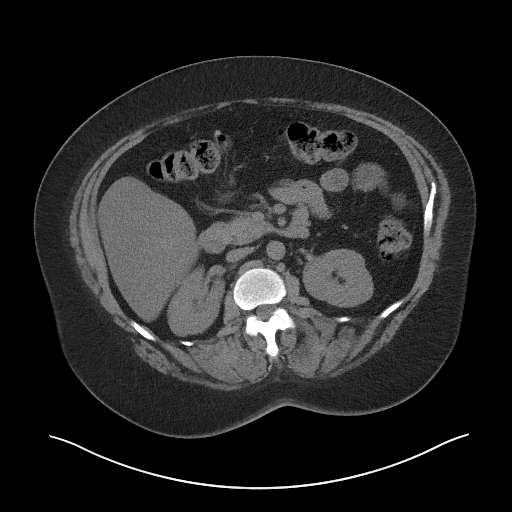
[im 64/98  bone]
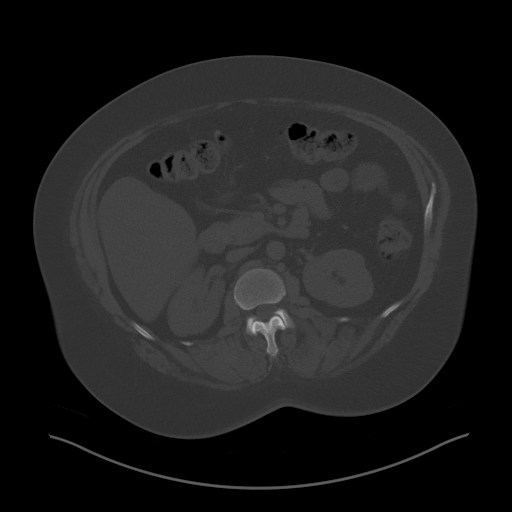
[im 72/98  soft-tissue]
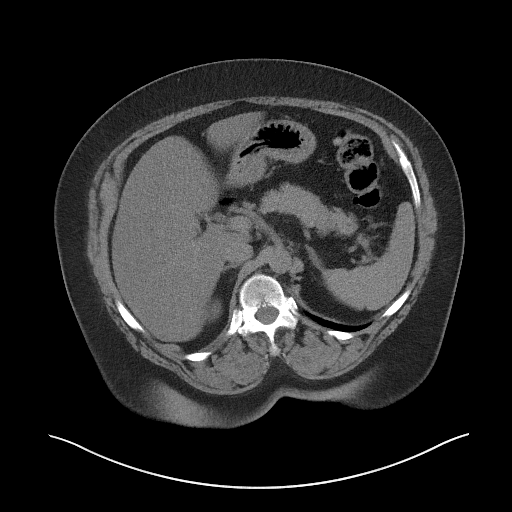
[im 76/98  soft-tissue]
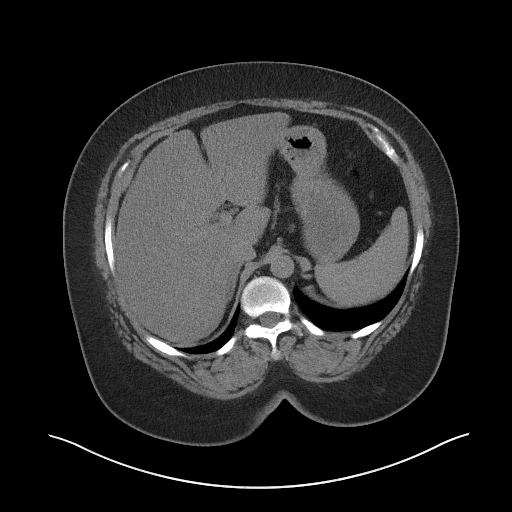
[im 85/98  soft-tissue]
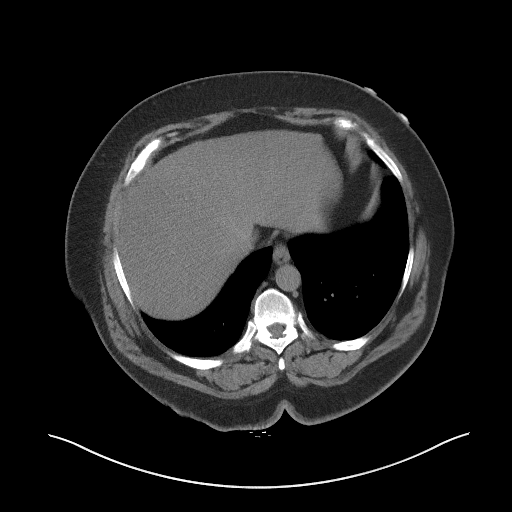
[im 93/98  soft-tissue]
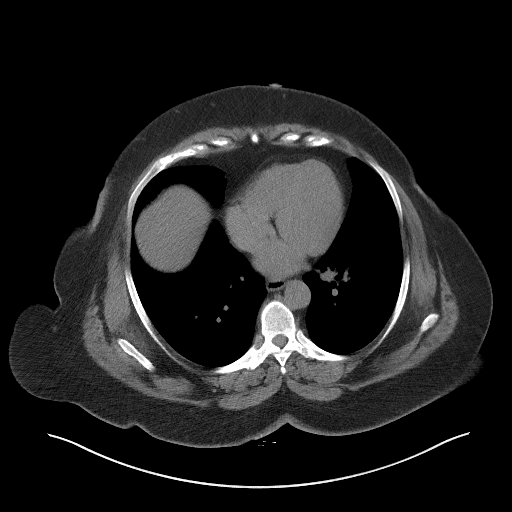

[Series 5: coronal · coronal · 0.80mm/px · 3 of 179 slices shown]
[im 60/179  soft-tissue]
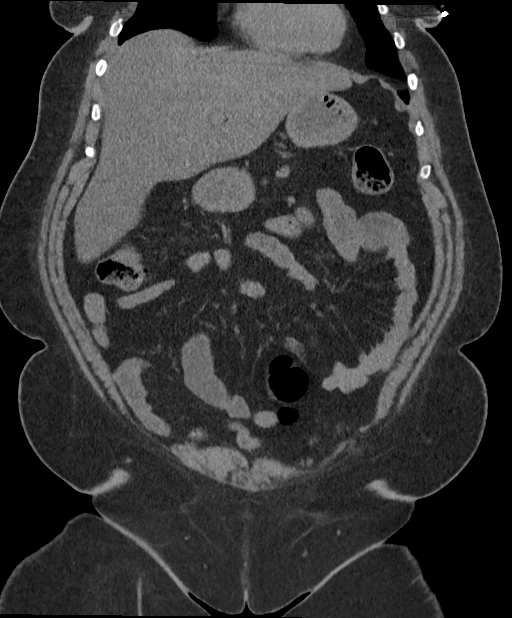
[im 80/179  soft-tissue]
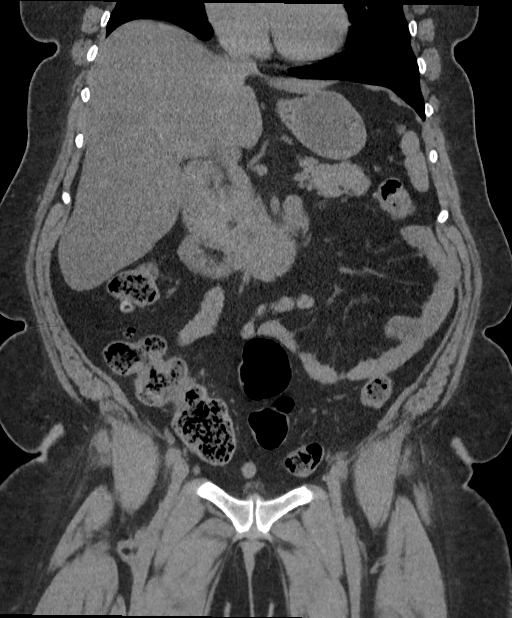
[im 99/179  soft-tissue]
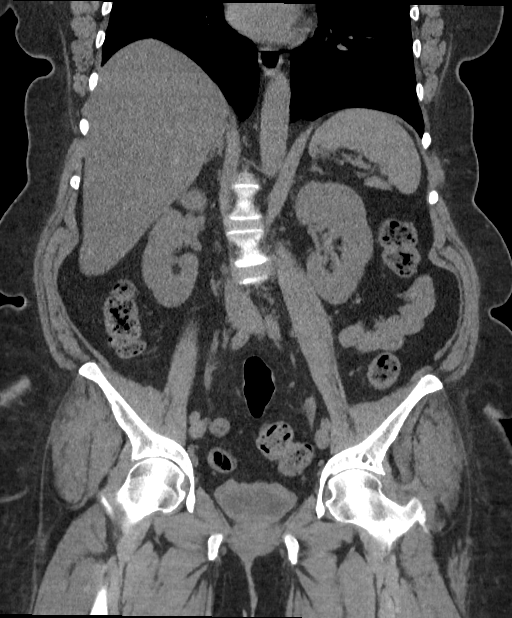

[16 of 46 positions shown; findings below may reference images not displayed]

FINDINGS: Lower chest: No acute abnormality.

Hepatobiliary: There is diffuse fatty infiltration of the liver
parenchyma. No focal liver abnormality is seen. Status post
cholecystectomy. No biliary dilatation.

Pancreas: Unremarkable. No pancreatic ductal dilatation or
surrounding inflammatory changes.

Spleen: Normal in size without focal abnormality.

Adrenals/Urinary Tract: Adrenal glands are unremarkable. Kidneys are
normal, without renal calculi, focal lesion, or hydronephrosis.
Bladder is unremarkable.

Stomach/Bowel: Stomach is within normal limits. Appendix appears
normal. No evidence of bowel wall thickening, distention, or
inflammatory changes.

Vascular/Lymphatic: No significant vascular findings are present. No
enlarged abdominal or pelvic lymph nodes.

Reproductive: Status post hysterectomy. No adnexal masses.

Other: No abdominal wall hernia or abnormality. No abdominopelvic
ascites.

Musculoskeletal: No acute or significant osseous findings.
IMPRESSION: 1. Fatty liver.
2. Evidence of prior cholecystectomy.
3. No acute or active process within the abdomen or pelvis.

## 2023-01-26 ENCOUNTER — Other Ambulatory Visit: Payer: Self-pay | Admitting: Gerontology

## 2023-01-26 DIAGNOSIS — Z1231 Encounter for screening mammogram for malignant neoplasm of breast: Secondary | ICD-10-CM
